# Patient Record
Sex: Male | Born: 1950 | Race: White | Hispanic: No | Marital: Married | Smoking: Never smoker
Health system: Southern US, Community
[De-identification: ages and names within clinical notes are randomized; demographics above are authoritative.]

## PROBLEM LIST (undated history)

## (undated) DIAGNOSIS — Z87442 Personal history of urinary calculi: Secondary | ICD-10-CM

## (undated) DIAGNOSIS — Z85828 Personal history of other malignant neoplasm of skin: Secondary | ICD-10-CM

## (undated) DIAGNOSIS — K5790 Diverticulosis of intestine, part unspecified, without perforation or abscess without bleeding: Secondary | ICD-10-CM

## (undated) DIAGNOSIS — L570 Actinic keratosis: Secondary | ICD-10-CM

## (undated) DIAGNOSIS — C801 Malignant (primary) neoplasm, unspecified: Secondary | ICD-10-CM

## (undated) DIAGNOSIS — M1990 Unspecified osteoarthritis, unspecified site: Secondary | ICD-10-CM

## (undated) HISTORY — DX: Personal history of other malignant neoplasm of skin: Z85.828

## (undated) HISTORY — DX: Actinic keratosis: L57.0

---

## 2005-02-07 ENCOUNTER — Ambulatory Visit: Payer: Self-pay | Admitting: Family Medicine

## 2005-02-21 ENCOUNTER — Ambulatory Visit: Payer: Self-pay | Admitting: Urology

## 2006-04-07 ENCOUNTER — Ambulatory Visit: Payer: Self-pay | Admitting: Urology

## 2007-04-12 ENCOUNTER — Ambulatory Visit: Payer: Self-pay | Admitting: Urology

## 2007-04-22 ENCOUNTER — Ambulatory Visit: Payer: Self-pay | Admitting: Urology

## 2007-07-17 ENCOUNTER — Emergency Department: Payer: Self-pay | Admitting: Emergency Medicine

## 2007-07-26 ENCOUNTER — Emergency Department: Payer: Self-pay | Admitting: Emergency Medicine

## 2009-03-10 IMAGING — CT CT ABDOMEN WO/W CM
2 of 4 series · 14 of 32 positions shown, 19 images · non-contrast
Comparison: none

REASON FOR EXAM: Left renal mass
COMMENTS:

PROCEDURE:     CT  - CT ABDOMEN STANDARD W/WO  - April 22, 2007  [DATE]
RESULT:     Triphasic CT of the abdomen is performed utilizing 100 ml of
Fsovue-YMR iodinated intravenous contrast.
Comparison is made to a previous CT dated 02/21/2005.

[Series 2: soft tissue w/o · axial · non-contrast · 0.68mm/px · z∈[-801,-606]mm · 8 of 51 slices shown, 13 images]
[im 6/51  soft-tissue]
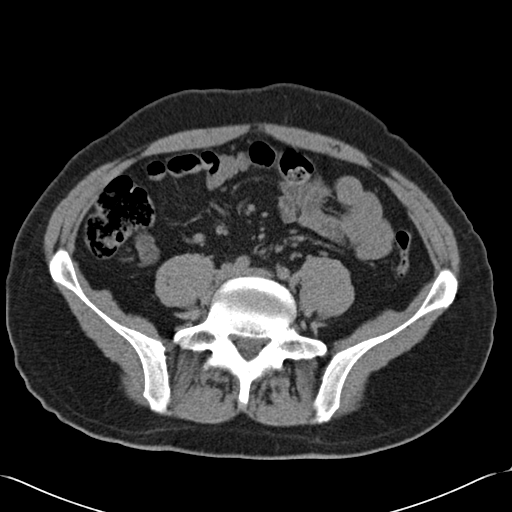
[im 6/51  bone]
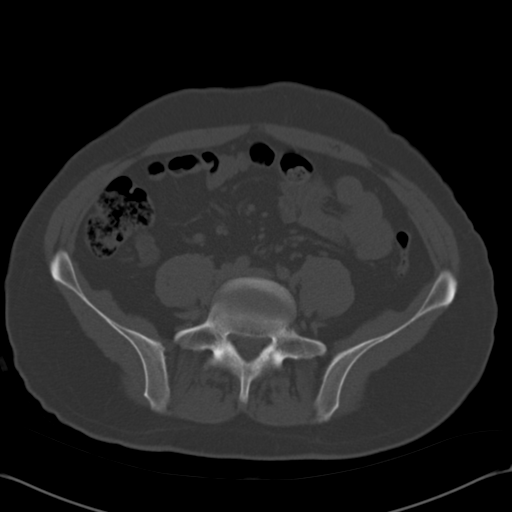
[im 12/51  soft-tissue]
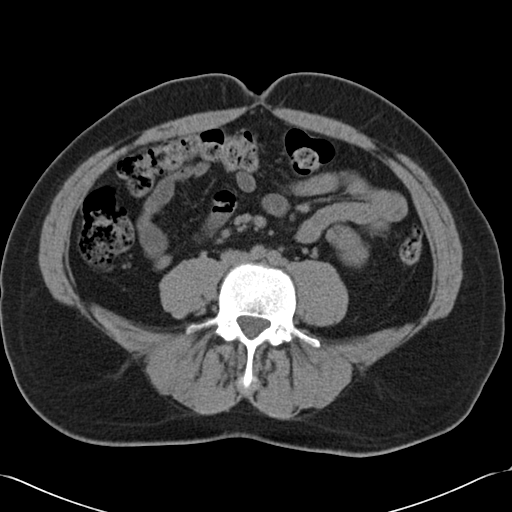
[im 17/51  soft-tissue]
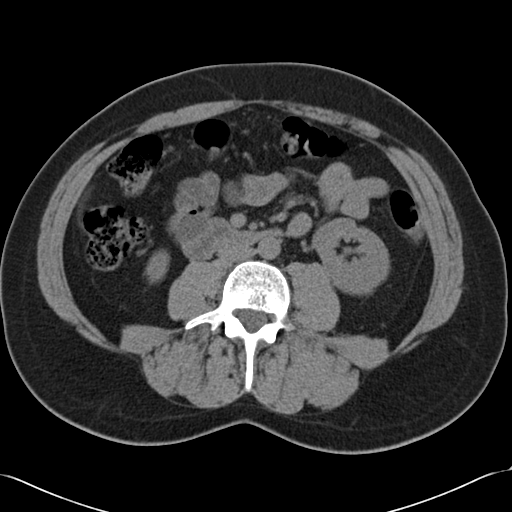
[im 23/51  soft-tissue]
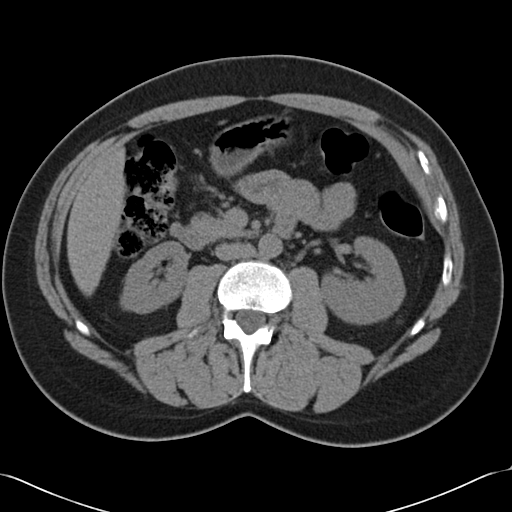
[im 28/51  soft-tissue]
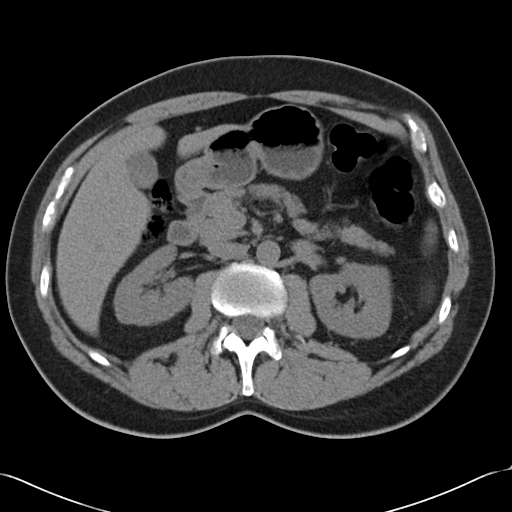
[im 28/51  lung]
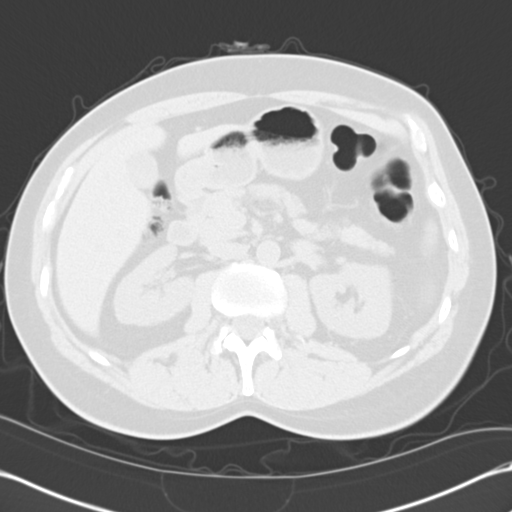
[im 34/51  soft-tissue]
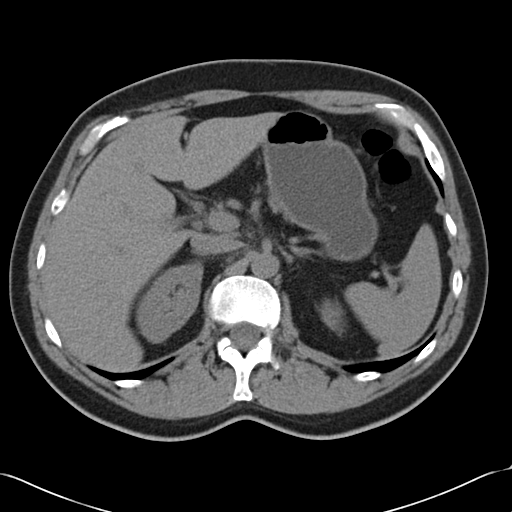
[im 34/51  lung]
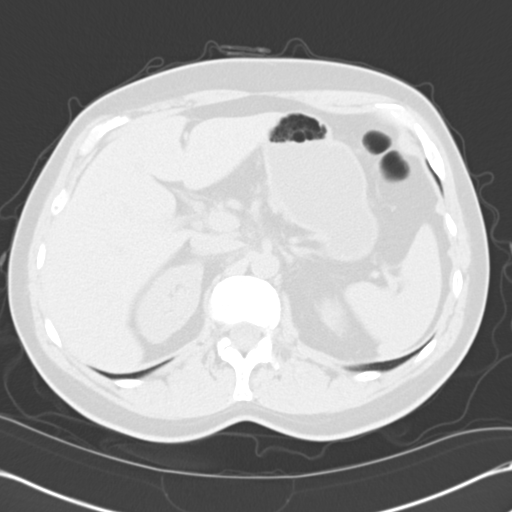
[im 39/51  soft-tissue]
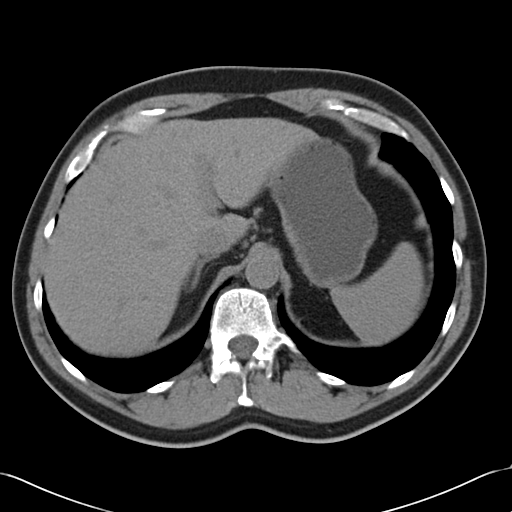
[im 39/51  lung]
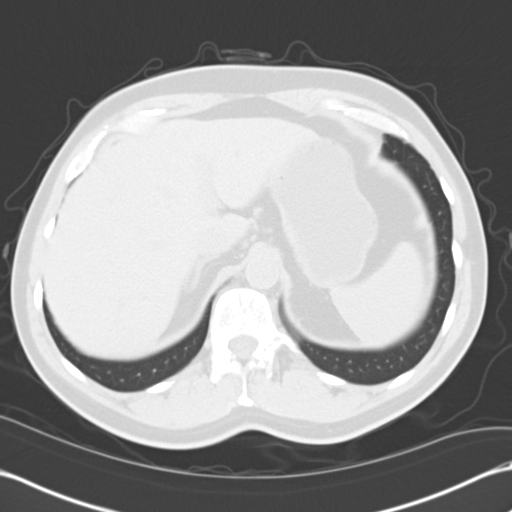
[im 45/51  soft-tissue]
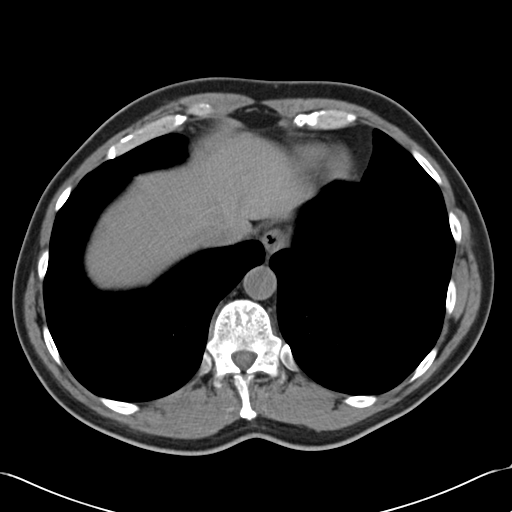
[im 45/51  lung]
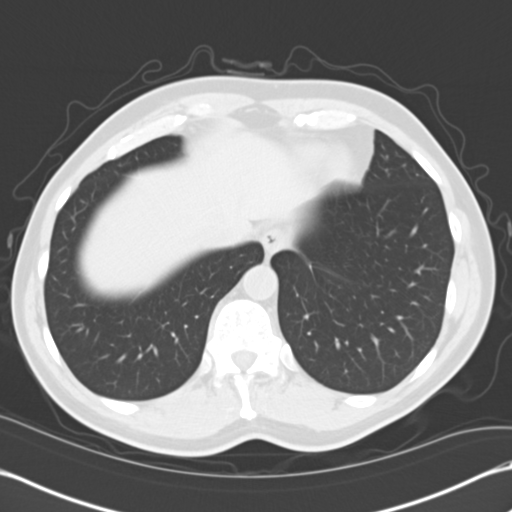

[Series 6: soft tissue delay · axial · delayed · 0.68mm/px · z∈[-797,-662]mm · 6 of 50 slices shown]
[im 6/50  soft-tissue]
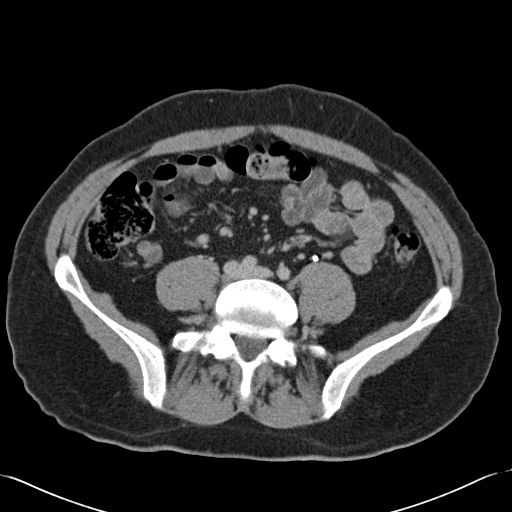
[im 11/50  soft-tissue]
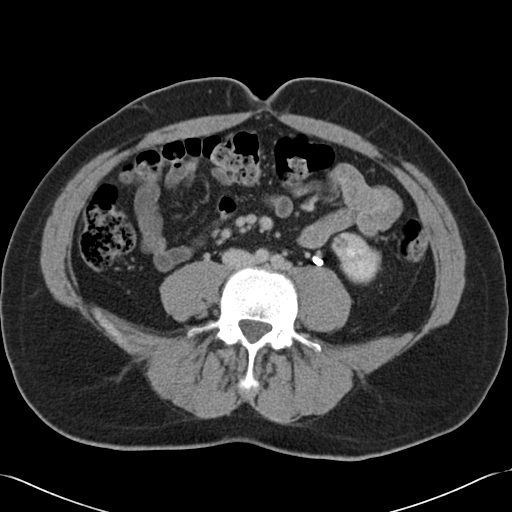
[im 17/50  soft-tissue]
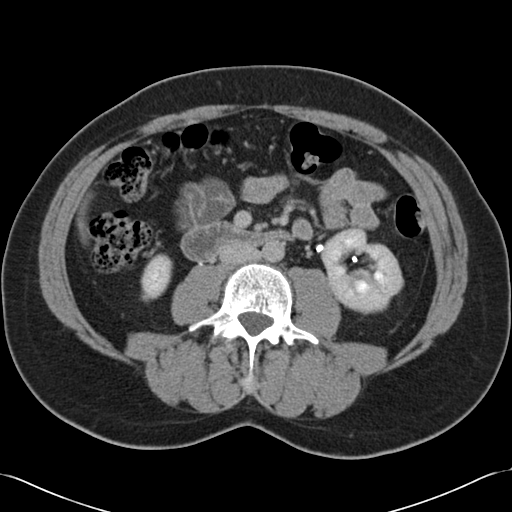
[im 22/50  soft-tissue]
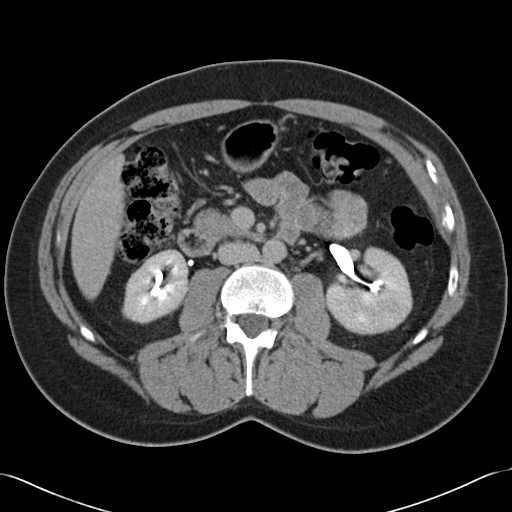
[im 28/50  soft-tissue]
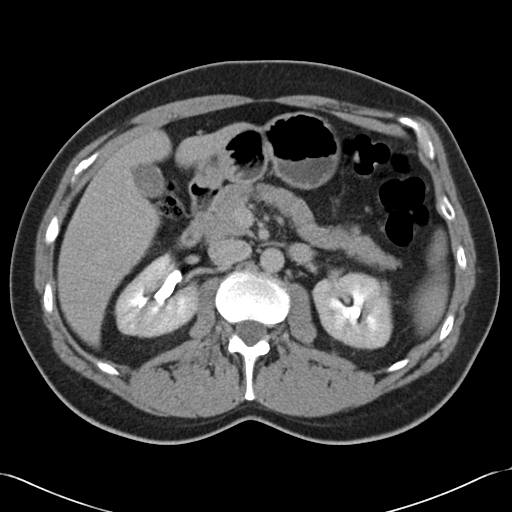
[im 33/50  soft-tissue]
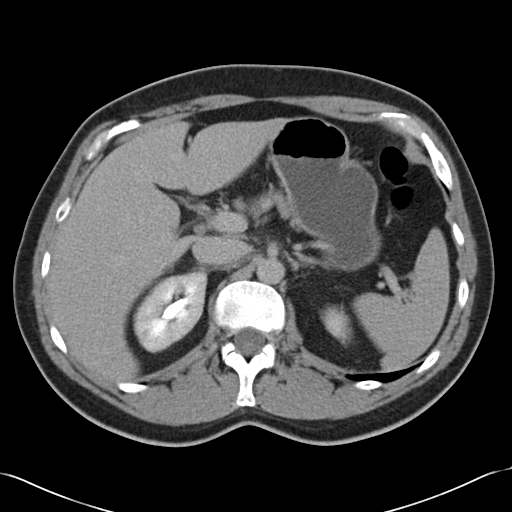

[14 of 32 positions shown; findings below may reference images not displayed]

FINDINGS: Images through the base of the lungs demonstrate normal aeration
with no mass, free air or free fluid.

The precontrast images demonstrate no evidence of hydronephrosis or
hydroureter. No definite urinary tract calculi are evident.

Following contrast administration, the kidneys appear to enhance normally.
There is some low attenuation along the anterolateral aspect of the mid to
lower pole region of the RIGHT kidney which is too small to accurately
characterize. There may be some sludge in the gallbladder. Delayed images
suggest an ill-defined area of low attenuation along the inferior pole of
the LEFT kidney anteriorly. This is unchanged when compared to the prior
study and again difficult to characterize. No definite solid masses are seen
otherwise. No findings suggestive of cysts are present otherwise.

The liver, spleen, adrenal glands, kidneys and gallbladder are otherwise
unremarkable. The abdominal aorta is normal in caliber. There is no free
fluid or free air. There is no abnormal fluid collection or abscess.
IMPRESSION: 1.  Possible gallbladder sludge.
2.  Low attenuation lesion in the RIGHT kidney which is stable and may
represent a small cyst.
3.  Less than 10.0 mm, low attenuation area in the lower pole the LEFT
kidney anteriorly which is unchanged and difficult to characterize further.

## 2010-03-17 HISTORY — PX: COLON SURGERY: SHX602

## 2010-11-21 ENCOUNTER — Ambulatory Visit: Payer: Self-pay | Admitting: Surgery

## 2010-11-28 ENCOUNTER — Ambulatory Visit: Payer: Self-pay | Admitting: Surgery

## 2011-03-18 HISTORY — PX: CHOLECYSTECTOMY: SHX55

## 2011-10-27 ENCOUNTER — Ambulatory Visit: Payer: Self-pay | Admitting: Gastroenterology

## 2011-11-10 ENCOUNTER — Observation Stay: Payer: Self-pay | Admitting: General Surgery

## 2011-11-10 ENCOUNTER — Ambulatory Visit: Payer: Self-pay | Admitting: Family Medicine

## 2011-11-10 LAB — HEPATIC FUNCTION PANEL A (ARMC)
Albumin: 3.7 g/dL (ref 3.4–5.0)
SGOT(AST): 62 U/L — ABNORMAL HIGH (ref 15–37)
SGPT (ALT): 62 U/L (ref 12–78)
Total Protein: 8 g/dL (ref 6.4–8.2)

## 2011-11-10 LAB — CBC WITH DIFFERENTIAL/PLATELET
Basophil %: 0.7 %
Eosinophil #: 0.1 10*3/uL (ref 0.0–0.7)
Eosinophil %: 0.7 %
HGB: 16.3 g/dL (ref 13.0–18.0)
Lymphocyte %: 13.6 %
MCHC: 33.7 g/dL (ref 32.0–36.0)
MCV: 91 fL (ref 80–100)
Monocyte #: 1 x10 3/mm (ref 0.2–1.0)
Neutrophil #: 7.9 10*3/uL — ABNORMAL HIGH (ref 1.4–6.5)
Neutrophil %: 75.3 %
Platelet: 224 10*3/uL (ref 150–440)
RBC: 5.31 10*6/uL (ref 4.40–5.90)

## 2011-11-12 LAB — PATHOLOGY REPORT

## 2011-11-15 LAB — WOUND CULTURE

## 2011-11-16 LAB — CULTURE, BLOOD (SINGLE)

## 2013-02-28 ENCOUNTER — Ambulatory Visit: Payer: Self-pay | Admitting: Family Medicine

## 2013-05-26 DIAGNOSIS — Z85828 Personal history of other malignant neoplasm of skin: Secondary | ICD-10-CM

## 2013-05-26 HISTORY — DX: Personal history of other malignant neoplasm of skin: Z85.828

## 2013-09-29 IMAGING — CR DG CHOLANGIOGRAM OPERATIVE
1 series · 1 of 1 positions shown · non-contrast
Comparison: none

REASON FOR EXAM: cholelithiasis
COMMENTS:

PROCEDURE:     DXR - DXR CHOLANGIOGRAM OP (INITIAL)  - November 11, 2011 [DATE]
RESULT:     Comparison: Abdominal ultrasound 11/10/2011

[[id] new series]
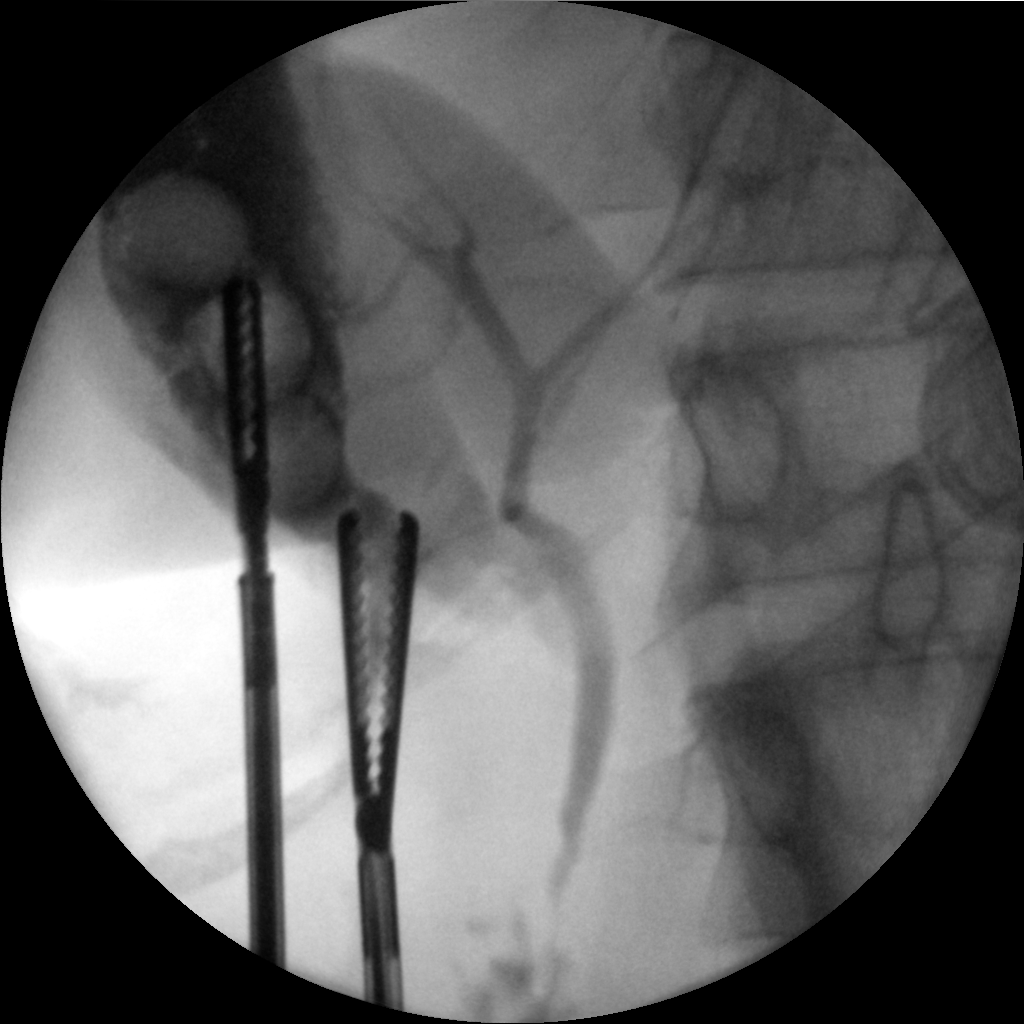

[1 of 1 positions shown; findings below may reference images not displayed]

FINDINGS: Single fluoroscopic image was submitted from intraoperative cholangiogram.
There are multiple filling defects within the gallbladder likely secondary
to gallstones. There is opacification of the common bile duct and central
intrahepatic biliary ducts, as was as the duodenum. No intraluminal filling
defect identified in the common bile duct. There is smooth tapering of the
common bile duct to the ampulla. The remainder of the interpretation is
deferred to the performing clinician.
IMPRESSION: Please see above.

[REDACTED]

## 2014-07-04 NOTE — Op Note (Signed)
PATIENT NAME:  Stephen Fry, Stephen Fry MR#:  [id number] DATE OF BIRTH:  11/29/1950  DATE OF PROCEDURE:  11/11/2011  PREOPERATIVE DIAGNOSIS: Acute cholecystitis and cholelithiasis.   POSTOPERATIVE DIAGNOSIS: Acute cholecystitis and cholelithiasis.   OPERATIVE PROCEDURE: Laparoscopic cholecystectomy with intraoperative cholangiograms.   SURGEON: Robert Bellow, MD  ANESTHESIA: General endotracheal under Dr. Kayleen Memos.   ESTIMATED BLOOD LOSS: 5 mL.   CLINICAL NOTE: This 64 year old male had been in his usual state of health until two days prior to admission when he developed vague upper abdominal discomfort. This increased over the next 24 hours. An ultrasound obtained yesterday suggested acute cholecystitis. He was admitted, placed on IV antibiotics and is brought to the Operating Room at this time for planned cholecystectomy. Pneumatic compression stockings and TED stockings were used for deep vein thrombosis prophylaxis. The patient did received Invanz the day prior to the procedure.   DESCRIPTION OF PROCEDURE: The patient underwent general endotracheal anesthesia without difficulty. Hair was removed from the abdomen with clippers. ChloraPrep was applied. A Veress needle was placed through a transumbilical incision. After assuring intra-abdominal location with the hanging drop test, the abdomen was insufflated with CO2 at 10 mmHg pressure. This was later increased to 12 mm for better visualization. A 10 mm step port was expanded and inspection showed no evidence of injury from initial port placement. The patient was placed in reverse Trendelenburg position and rolled to the left. An 11 mm Xcel port was placed in the epigastrium and two 5-mm step ports placed in the right lateral abdominal wall. There was noted to be acute inflammation of the gallbladder. Adhesions of the omentum to the gallbladder were taken down with cautery dissection as well as a Engineering geologist. The neck of the gallbladder was  cleared. The cystic duct was identified. A Kumar clamp was placed and fluoroscopic cholangiograms completed using 15 mL of one-half strength Conray 60. This showed prompt filling of the right and left hepatic ducts, normal caliber of the common bile duct, and free flow into the duodenum. No evidence of retained stones. The cystic duct and multiple branches of the cystic artery were doubly clipped and divided. The gallbladder was then removed from the liver bed making use of hook cautery dissection. Of note, the gallbladder was decompressed to allow grasping forceps to attached to it at the beginning of the procedure. The gallbladder was placed in an Endo Catch bag and then delivered to the umbilical port site. It was now necessary to expand the incision to allow extraction of the thick walled gallbladder and large stones. After the gallbladder had been retrieved pneumoperitoneum was re-established. Examination of the epigastric site showed no evidence of injury from initial port placement. The right upper quadrant was irrigated with lactated Ringer's solution. Good hemostasis was noted. Clip placement was felt to be appropriate. The abdomen was then desufflated and ports removed under direct vision. The fascia at the umbilicus was closed with interrupted 0 PDS suture in figure-of-eight fashion. Skin incisions were closed with 4-0 Vicryl subcuticular sutures. Benzoin, Steri-Strips, Telfa, and Tegaderm dressings were the applied.   Patient tolerated the procedure well and was taken to the recovery room in stable condition.  ____________________________ Robert Bellow, MD jwb:cms D: 11/11/2011 19:34:26 ET T: 11/12/2011 10:24:25 ET JOB#: [id number]  cc: Robert Bellow, MD, <Dictator> Jobe Igo. Gilford Raid, MD JEFFREY Amedeo Kinsman MD ELECTRONICALLY SIGNED 11/13/2011 20:39

## 2015-01-12 ENCOUNTER — Encounter: Payer: Self-pay | Admitting: *Deleted

## 2015-01-15 ENCOUNTER — Encounter: Payer: Self-pay | Admitting: *Deleted

## 2015-01-15 ENCOUNTER — Encounter
Admission: RE | Disposition: A | Discharge status: Home Health | Payer: Self-pay | Source: Ambulatory Visit | Attending: Gastroenterology

## 2015-01-15 ENCOUNTER — Ambulatory Visit
Admission: RE | Admit: 2015-01-15 | Discharge: 2015-01-15 | Disposition: A | Discharge status: Home Health | Payer: Managed Care, Other (non HMO) | Source: Ambulatory Visit | Attending: Gastroenterology | Admitting: Gastroenterology

## 2015-01-15 ENCOUNTER — Ambulatory Visit: Payer: Managed Care, Other (non HMO) | Admitting: *Deleted

## 2015-01-15 DIAGNOSIS — Z86010 Personal history of colonic polyps: Secondary | ICD-10-CM | POA: Insufficient documentation

## 2015-01-15 DIAGNOSIS — D123 Benign neoplasm of transverse colon: Secondary | ICD-10-CM | POA: Insufficient documentation

## 2015-01-15 DIAGNOSIS — Z79899 Other long term (current) drug therapy: Secondary | ICD-10-CM | POA: Insufficient documentation

## 2015-01-15 DIAGNOSIS — D124 Benign neoplasm of descending colon: Secondary | ICD-10-CM | POA: Diagnosis not present

## 2015-01-15 DIAGNOSIS — K5730 Diverticulosis of large intestine without perforation or abscess without bleeding: Secondary | ICD-10-CM | POA: Diagnosis not present

## 2015-01-15 HISTORY — PX: COLONOSCOPY WITH PROPOFOL: SHX5780

## 2015-01-15 HISTORY — DX: Diverticulosis of intestine, part unspecified, without perforation or abscess without bleeding: K57.90

## 2015-01-15 SURGERY — COLONOSCOPY WITH PROPOFOL
Anesthesia: General

## 2015-01-15 MED ORDER — SODIUM CHLORIDE 0.9 % IV SOLN: INTRAVENOUS | Status: DC

## 2015-01-15 MED ORDER — SODIUM CHLORIDE 0.9 % IV SOLN
INTRAVENOUS | Status: DC
  Administered 2015-01-15: 1000 mL via INTRAVENOUS

## 2015-01-15 MED ORDER — PROPOFOL 500 MG/50ML IV EMUL
INTRAVENOUS | Status: DC | PRN
  Administered 2015-01-15: 120 ug/kg/min via INTRAVENOUS

## 2015-01-15 MED ORDER — FENTANYL CITRATE (PF) 100 MCG/2ML IJ SOLN
INTRAMUSCULAR | Status: DC | PRN
  Administered 2015-01-15: 50 ug via INTRAVENOUS

## 2015-01-15 MED ORDER — MIDAZOLAM HCL 2 MG/2ML IJ SOLN
INTRAMUSCULAR | Status: DC | PRN
  Administered 2015-01-15: 1 mg via INTRAVENOUS

## 2015-01-15 NOTE — Anesthesia Postprocedure Evaluation (Signed)
  Anesthesia Post-op Note  Patient: Stephen Fry  Procedure(s) Performed: Procedure(s): COLONOSCOPY WITH PROPOFOL (N/A)  Anesthesia type:General  Patient location: PACU  Post pain: Pain level controlled  Post assessment: Post-op Vital signs reviewed, Patient's Cardiovascular Status Stable, Respiratory Function Stable, Patent Airway and No signs of Nausea or vomiting  Post vital signs: Reviewed and stable  Last Vitals:  Filed Vitals:   01/15/15 1331  BP: 127/63  Pulse: 72  Temp: 37 C  Resp: 20    Level of consciousness: awake, alert  and patient cooperative  Complications: No apparent anesthesia complications

## 2015-01-15 NOTE — H&P (Signed)
Outpatient short stay form Pre-procedure 01/15/2015 2:16 PM [name] MD  Primary Physician: Dr. Ramonita Lab  Reason for visit:  Colonoscopy  History of present illness:  Patient is a 64 year old male presenting today for a colonoscopy. His last colonoscopy was in 2013 and he has a history of large adenoma. He tolerated his prep well. He denies use of any over-the-counter aspirin products or any blood thinners.    Current facility-administered medications:  .  0.9 %  sodium chloride infusion, , Intravenous, Continuous, [name], MD, Last Rate: 20 mL/hr at 01/15/15 1340, 1,000 mL at 01/15/15 1340 .  0.9 %  sodium chloride infusion, , Intravenous, Continuous, [name], MD  Prescriptions prior to admission  Medication Sig Dispense Refill Last Dose  . meloxicam (MOBIC) 15 MG tablet Take 15 mg by mouth daily.   Not Taking at Unknown time     No Known Allergies   Past Medical History  Diagnosis Date  . Diverticulosis     Review of systems:      Physical Exam    Heart and lungs: Regular rate and rhythm without rub or gallop, lungs are bilaterally clear    HEENT: Normocephalic atraumatic eyes are anicteric    Other:     Pertinant exam for procedure: soft nontender nondistended, bowel sounds normal    Planned proceedures: colonosocpy and indicated proceedures. I have discussed the risks benefits and complications of procedures to include not limited to bleeding, infection, perforation and the risk of sedation and the patient wishes to proceed.    [name], MD Gastroenterology 01/15/2015  2:16 PM

## 2015-01-15 NOTE — Op Note (Signed)
[facility] Gastroenterology Patient Name: Oluwadarasimi Redmon Procedure Date: 01/15/2015 2:22 PM MRN: [id number] Account #: [id number] Date of Birth: February 25, 1951 Admit Type: Outpatient Age: 64 Room: [facility] ENDO ROOM 3 Gender: Male Note Status: Finalized Procedure:         Colonoscopy Indications:       Personal history of colonic polyps Providers:         [name], MD Referring MD:      [name], MD (Referring MD) Medicines:         Monitored Anesthesia Care Complications:     No immediate complications. Procedure:         Pre-Anesthesia Assessment:                    - ASA Grade Assessment: II - A patient with mild systemic                     disease.                    - ASA Grade Assessment: II - A patient with mild systemic                     disease.                    After obtaining informed consent, the colonoscope was                     passed under direct vision. Throughout the procedure, the                     patient's blood pressure, pulse, and oxygen saturations                     were monitored continuously. The Colonoscope was                     introduced through the anus and advanced to the the cecum,                     identified by appendiceal orifice and ileocecal valve. The                     colonoscopy was performed without difficulty. The patient                     tolerated the procedure well. The quality of the bowel                     preparation was good. Findings:      Two sessile polyps were found at the hepatic flexure. The polyps were 2       to 4 mm in size. These polyps were removed with a cold snare. Resection       and retrieval were complete.      A 2 mm polyp was found in the descending colon. The polyp was sessile.       The polyp was removed with a cold biopsy forceps. Resection and       retrieval were complete.      A few small-mouthed diverticula were found in the sigmoid colon and in   the descending colon.      The digital rectal exam was normal. Impression:        - Two  2 to 4 mm polyps at the hepatic flexure. Resected                     and retrieved.                    - One 2 mm polyp in the descending colon. Resected and                     retrieved.                    - Diverticulosis in the sigmoid colon and in the                     descending colon. Recommendation:    - Await pathology results.                    - Telephone GI clinic for pathology results in 1 week. Procedure Code(s): --- Professional ---                    [phone], Colonoscopy, flexible; with removal of tumor(s),                     polyp(s), or other lesion(s) by snare technique                    45380, 93, Colonoscopy, flexible; with biopsy, single or                     multiple Diagnosis Code(s): --- Professional ---                    211.3, Benign neoplasm of colon                    V12.72, Personal history of colonic polyps                    562.10, Diverticulosis of colon (without mention of                     hemorrhage) CPT copyright 2014 American Medical Association. All rights reserved. The codes documented in this report are preliminary and upon coder review may  be revised to meet current compliance requirements. [name], MD 01/15/2015 2:56:06 PM This report has been signed electronically. Number of Addenda: 0 Note Initiated On: 01/15/2015 2:22 PM Scope Withdrawal Time: 0 hours 13 minutes 0 seconds  Total Procedure Duration: 0 hours 24 minutes 38 seconds       [facility]

## 2015-01-15 NOTE — Transfer of Care (Signed)
Immediate Anesthesia Transfer of Care Note  Patient: Stephen Fry  Procedure(s) Performed: Procedure(s): COLONOSCOPY WITH PROPOFOL (N/A)  Patient Location: PACU and Endoscopy Unit  Anesthesia Type:General  Level of Consciousness: sedated and responds to stimulation  Airway & Oxygen Therapy: Patient Spontanous Breathing and Patient connected to nasal cannula oxygen  Post-op Assessment: Report given to RN and Post -op Vital signs reviewed and stable  Post vital signs: Reviewed and stable  Last Vitals:  Filed Vitals:   01/15/15 1458  BP: 87/57  Pulse: 81  Temp: 35.6 C  Resp: 14    Complications: No apparent anesthesia complications

## 2015-01-15 NOTE — Anesthesia Preprocedure Evaluation (Signed)
Anesthesia Evaluation  Patient identified by MRN, date of birth, ID band Patient awake    Reviewed: Allergy & Precautions, NPO status , Patient's Chart, lab work & pertinent test results  Airway Mallampati: II  TM Distance: >3 FB Neck ROM: Full    Dental  (+) Teeth Intact   Pulmonary           Cardiovascular Exercise Tolerance: Good negative cardio ROS Normal cardiovascular exam     Neuro/Psych    GI/Hepatic He had a large polyp in 2012, and it was resected. He is under surveillance--has had multiple colonoscopies.   Endo/Other    Renal/GU      Musculoskeletal   Abdominal Normal abdominal exam  (+)   Peds  Hematology   Anesthesia Other Findings   Reproductive/Obstetrics                             Anesthesia Physical Anesthesia Plan  ASA: II  Anesthesia Plan: General   Post-op Pain Management:    Induction: Intravenous  Airway Management Planned: Nasal Cannula  Additional Equipment:   Intra-op Plan:   Post-operative Plan:   Informed Consent: I have reviewed the patients History and Physical, chart, labs and discussed the procedure including the risks, benefits and alternatives for the proposed anesthesia with the patient or authorized representative who has indicated his/her understanding and acceptance.     Plan Discussed with: CRNA  Anesthesia Plan Comments:         Anesthesia Quick Evaluation

## 2015-01-16 ENCOUNTER — Encounter: Payer: Self-pay | Admitting: Gastroenterology

## 2015-01-17 LAB — SURGICAL PATHOLOGY

## 2015-12-31 DIAGNOSIS — C4491 Basal cell carcinoma of skin, unspecified: Secondary | ICD-10-CM

## 2015-12-31 HISTORY — DX: Basal cell carcinoma of skin, unspecified: C44.91

## 2016-03-17 DIAGNOSIS — Z87442 Personal history of urinary calculi: Secondary | ICD-10-CM

## 2016-03-17 HISTORY — DX: Personal history of urinary calculi: Z87.442

## 2016-06-09 ENCOUNTER — Other Ambulatory Visit: Payer: Self-pay | Admitting: Orthopedic Surgery

## 2016-06-17 ENCOUNTER — Encounter
Admission: RE | Admit: 2016-06-17 | Discharge: 2016-06-17 | Disposition: A | Discharge status: Home / Self Care | Payer: Worker's Compensation | Source: Ambulatory Visit | Attending: Orthopedic Surgery | Admitting: Orthopedic Surgery

## 2016-06-17 DIAGNOSIS — Z01812 Encounter for preprocedural laboratory examination: Secondary | ICD-10-CM | POA: Insufficient documentation

## 2016-06-17 HISTORY — DX: Malignant (primary) neoplasm, unspecified: C80.1

## 2016-06-17 HISTORY — DX: Unspecified osteoarthritis, unspecified site: M19.90

## 2016-06-17 HISTORY — DX: Personal history of urinary calculi: Z87.442

## 2016-06-17 NOTE — Patient Instructions (Signed)
  Your procedure is scheduled on: 06-24-16 (Tuesday) Report to Same Day Surgery 2nd floor medical mall [facility] Entrance-take elevator on left to 2nd floor.  Check in with surgery information desk.) To find out your arrival time please call [phone] between 1PM - 3PM on 06-23-16 (Monday)  Remember: Instructions that are not followed completely may result in serious medical risk, up to and including death, or upon the discretion of your surgeon and anesthesiologist your surgery may need to be rescheduled.    _x___ 1. Do not eat food or drink liquids after midnight. No gum chewing or hard candies.     __x__ 2. No Alcohol for 24 hours before or after surgery.   __x__3. No Smoking for 24 prior to surgery.   ____  4. Bring all medications with you on the day of surgery if instructed.    __x__ 5. Notify your doctor if there is any change in your medical condition     (cold, fever, infections).     Do not wear jewelry, make-up, hairpins, clips or nail polish.  Do not wear lotions, powders, or perfumes. You may wear deodorant.  Do not shave 48 hours prior to surgery. Men may shave face and neck.  Do not bring valuables to the hospital.    [facility] is not responsible for any belongings or valuables.               Contacts, dentures or bridgework may not be worn into surgery.  Leave your suitcase in the car. After surgery it may be brought to your room.  For patients admitted to the hospital, discharge time is determined by your treatment team.   Patients discharged the day of surgery will not be allowed to drive home.  You will need someone to drive you home and stay with you the night of your procedure.    Please read over the following fact sheets that you were given:     ____ Take anti-hypertensive (unless it includes a diuretic), cardiac, seizure, asthma,  anti-reflux and psychiatric medicines. These include:  1. NONE  2.  3.  4.  5.  6.  ____Fleets enema or Magnesium  Citrate as directed.   _x___ Use CHG Soap or sage wipes as directed on instruction sheet   ____ Use inhalers on the day of surgery and bring to hospital day of surgery  ____ Stop Metformin and Janumet 2 days prior to surgery.    ____ Take 1/2 of usual insulin dose the night before surgery and none on the morning     surgery.   ____ Follow recommendations from Cardiologist, Pulmonologist or PCP regarding stopping Aspirin, Coumadin, Pllavix ,Eliquis, Effient, or Pradaxa, and Pletal.  X____Stop Anti-inflammatories such as Advil, Aleve, IBUPROFEN, Motrin, Naproxen, Naprosyn, Goodies powders or aspirin products NOW-OK to take Tylenol   _x___ Stop supplements until after surgery-STOP MELATONIN NOW   ____ Bring C-Pap to the hospital.

## 2016-06-18 ENCOUNTER — Encounter
Admission: RE | Admit: 2016-06-18 | Discharge: 2016-06-18 | Disposition: A | Discharge status: Home / Self Care | Payer: Worker's Compensation | Source: Ambulatory Visit | Attending: Orthopedic Surgery | Admitting: Orthopedic Surgery

## 2016-06-18 DIAGNOSIS — Z01812 Encounter for preprocedural laboratory examination: Secondary | ICD-10-CM | POA: Diagnosis present

## 2016-06-18 LAB — BASIC METABOLIC PANEL
Anion gap: 5 (ref 5–15)
BUN: 12 mg/dL (ref 6–20)
CALCIUM: 9.5 mg/dL (ref 8.9–10.3)
CO2: 30 mmol/L (ref 22–32)
CREATININE: 0.96 mg/dL (ref 0.61–1.24)
Chloride: 104 mmol/L (ref 101–111)
GFR calc Af Amer: >60 mL/min (ref >60)
GLUCOSE: 90 mg/dL (ref 65–99)
Potassium: 4.1 mmol/L (ref 3.5–5.1)
Sodium: 139 mmol/L (ref 135–145)

## 2016-06-18 LAB — CBC WITH DIFFERENTIAL/PLATELET
BASOS ABS: 0.1 10*3/uL (ref 0–0.1)
BASOS PCT: 1 %
EOS ABS: 0.4 10*3/uL (ref 0–0.7)
Eosinophils Relative: 6 %
HCT: 45.5 % (ref 40.0–52.0)
Hemoglobin: 15.4 g/dL (ref 13.0–18.0)
Lymphocytes Relative: 29 %
Lymphs Abs: 1.7 10*3/uL (ref 1.0–3.6)
MCH: 30.2 pg (ref 26.0–34.0)
MCHC: 33.9 g/dL (ref 32.0–36.0)
MCV: 89.3 fL (ref 80.0–100.0)
MONO ABS: 0.6 10*3/uL (ref 0.2–1.0)
MONOS PCT: 11 %
Neutro Abs: 3.1 10*3/uL (ref 1.4–6.5)
Neutrophils Relative %: 53 %
PLATELETS: 226 10*3/uL (ref 150–440)
RBC: 5.09 MIL/uL (ref 4.40–5.90)
RDW: 13.5 % (ref 11.5–14.5)
WBC: 5.9 10*3/uL (ref 3.8–10.6)

## 2016-06-18 LAB — PROTIME-INR
INR: 0.92
PROTHROMBIN TIME: 12.4 s (ref 11.4–15.2)

## 2016-06-18 LAB — APTT: APTT: 30 s (ref 24–36)

## 2016-06-23 MED ORDER — FAMOTIDINE 20 MG PO TABS
20.0000 mg | ORAL_TABLET | Freq: Once | ORAL | Status: AC
  Administered 2016-06-24: 20 mg via ORAL

## 2016-06-23 MED ORDER — CEFAZOLIN SODIUM-DEXTROSE 2-4 GM/100ML-% IV SOLN: 2.0000 g | INTRAVENOUS | Status: AC

## 2016-06-23 NOTE — Pre-Procedure Instructions (Signed)
MEDICAL CLEARANCE ON CHART FROM PCP-LOW RISK 

## 2016-06-24 ENCOUNTER — Encounter: Payer: Self-pay | Admitting: *Deleted

## 2016-06-24 ENCOUNTER — Ambulatory Visit: Payer: Worker's Compensation | Admitting: Anesthesiology

## 2016-06-24 ENCOUNTER — Ambulatory Visit
Admission: RE | Admit: 2016-06-24 | Discharge: 2016-06-24 | Disposition: A | Discharge status: Home / Self Care | Payer: Worker's Compensation | Source: Ambulatory Visit | Attending: Orthopedic Surgery | Admitting: Orthopedic Surgery

## 2016-06-24 ENCOUNTER — Encounter
Admission: RE | Disposition: A | Discharge status: Home / Self Care | Payer: Self-pay | Source: Ambulatory Visit | Attending: Orthopedic Surgery

## 2016-06-24 DIAGNOSIS — S43432A Superior glenoid labrum lesion of left shoulder, initial encounter: Secondary | ICD-10-CM | POA: Diagnosis not present

## 2016-06-24 DIAGNOSIS — M7552 Bursitis of left shoulder: Secondary | ICD-10-CM | POA: Insufficient documentation

## 2016-06-24 DIAGNOSIS — M7542 Impingement syndrome of left shoulder: Secondary | ICD-10-CM | POA: Insufficient documentation

## 2016-06-24 DIAGNOSIS — Z79899 Other long term (current) drug therapy: Secondary | ICD-10-CM | POA: Diagnosis not present

## 2016-06-24 DIAGNOSIS — E559 Vitamin D deficiency, unspecified: Secondary | ICD-10-CM | POA: Diagnosis not present

## 2016-06-24 DIAGNOSIS — X58XXXA Exposure to other specified factors, initial encounter: Secondary | ICD-10-CM | POA: Diagnosis not present

## 2016-06-24 DIAGNOSIS — G479 Sleep disorder, unspecified: Secondary | ICD-10-CM | POA: Insufficient documentation

## 2016-06-24 HISTORY — PX: SHOULDER ARTHROSCOPY WITH BANKART REPAIR: SHX5673

## 2016-06-24 SURGERY — SHOULDER ARTHROSCOPY WITH BANKART REPAIR
Anesthesia: Regional | Laterality: Left | Wound class: Clean

## 2016-06-24 MED ORDER — ONDANSETRON HCL 4 MG/2ML IJ SOLN
INTRAMUSCULAR | Status: DC | PRN
  Administered 2016-06-24: 4 mg via INTRAVENOUS

## 2016-06-24 MED ORDER — ACETAMINOPHEN 10 MG/ML IV SOLN
INTRAVENOUS | Status: DC | PRN
  Administered 2016-06-24: 1000 mg via INTRAVENOUS

## 2016-06-24 MED ORDER — ACETAMINOPHEN 10 MG/ML IV SOLN
INTRAVENOUS | Status: AC
  Filled 2016-06-24: qty 100

## 2016-06-24 MED ORDER — ROPIVACAINE HCL 5 MG/ML IJ SOLN
INTRAMUSCULAR | Status: DC | PRN
  Administered 2016-06-24: 20 mL via PERINEURAL
  Administered 2016-06-24: 10 mL via PERINEURAL

## 2016-06-24 MED ORDER — BUPIVACAINE HCL (PF) 0.25 % IJ SOLN
INTRAMUSCULAR | Status: DC | PRN
  Administered 2016-06-24: 30 mL

## 2016-06-24 MED ORDER — DEXAMETHASONE SODIUM PHOSPHATE 10 MG/ML IJ SOLN
INTRAMUSCULAR | Status: AC
  Filled 2016-06-24: qty 1

## 2016-06-24 MED ORDER — FENTANYL CITRATE (PF) 100 MCG/2ML IJ SOLN
INTRAMUSCULAR | Status: AC
  Administered 2016-06-24: 50 ug via INTRAVENOUS
  Filled 2016-06-24: qty 2

## 2016-06-24 MED ORDER — LIDOCAINE HCL (PF) 2 % IJ SOLN
INTRAMUSCULAR | Status: AC
  Filled 2016-06-24: qty 2

## 2016-06-24 MED ORDER — FENTANYL CITRATE (PF) 100 MCG/2ML IJ SOLN: 25.0000 ug | INTRAMUSCULAR | Status: DC | PRN

## 2016-06-24 MED ORDER — ROPIVACAINE HCL 5 MG/ML IJ SOLN
INTRAMUSCULAR | Status: AC
  Filled 2016-06-24: qty 40

## 2016-06-24 MED ORDER — NEOMYCIN-POLYMYXIN B GU 40-200000 IR SOLN
Status: AC
  Filled 2016-06-24: qty 2

## 2016-06-24 MED ORDER — SUGAMMADEX SODIUM 500 MG/5ML IV SOLN
INTRAVENOUS | Status: DC | PRN
  Administered 2016-06-24: 160 mg via INTRAVENOUS

## 2016-06-24 MED ORDER — ROCURONIUM BROMIDE 50 MG/5ML IV SOLN
INTRAVENOUS | Status: AC
  Filled 2016-06-24: qty 1

## 2016-06-24 MED ORDER — LACTATED RINGERS IV SOLN
INTRAVENOUS | Status: DC
  Administered 2016-06-24: 07:00:00 via INTRAVENOUS

## 2016-06-24 MED ORDER — MIDAZOLAM HCL 2 MG/2ML IJ SOLN
1.0000 mg | Freq: Once | INTRAMUSCULAR | Status: AC
  Administered 2016-06-24: 1 mg via INTRAVENOUS

## 2016-06-24 MED ORDER — OXYCODONE HCL 5 MG/5ML PO SOLN: 5.0000 mg | Freq: Once | ORAL | Status: DC | PRN

## 2016-06-24 MED ORDER — PHENYLEPHRINE HCL 10 MG/ML IJ SOLN
INTRAMUSCULAR | Status: DC | PRN
  Administered 2016-06-24: 200 ug via INTRAVENOUS
  Administered 2016-06-24 (×2): 100 ug via INTRAVENOUS
  Administered 2016-06-24: 200 ug via INTRAVENOUS

## 2016-06-24 MED ORDER — LIDOCAINE HCL (PF) 1 % IJ SOLN
INTRAMUSCULAR | Status: AC
  Filled 2016-06-24: qty 30

## 2016-06-24 MED ORDER — EPHEDRINE SULFATE 50 MG/ML IJ SOLN
INTRAMUSCULAR | Status: AC
  Filled 2016-06-24: qty 1

## 2016-06-24 MED ORDER — ONDANSETRON HCL 4 MG PO TABS: 4.0000 mg | ORAL_TABLET | Freq: Three times a day (TID) | ORAL | 0 refills | Status: AC | PRN

## 2016-06-24 MED ORDER — DEXAMETHASONE SODIUM PHOSPHATE 10 MG/ML IJ SOLN
INTRAMUSCULAR | Status: DC | PRN
  Administered 2016-06-24: 10 mg via INTRAVENOUS

## 2016-06-24 MED ORDER — LIDOCAINE HCL (PF) 1 % IJ SOLN
INTRAMUSCULAR | Status: DC | PRN
  Administered 2016-06-24: 1 mL via INTRADERMAL

## 2016-06-24 MED ORDER — LIDOCAINE HCL (PF) 1 % IJ SOLN
INTRAMUSCULAR | Status: DC | PRN
  Administered 2016-06-24: 10 mL
  Administered 2016-06-24: 20 mL

## 2016-06-24 MED ORDER — PHENYLEPHRINE HCL 10 MG/ML IJ SOLN
INTRAMUSCULAR | Status: AC
  Filled 2016-06-24: qty 1

## 2016-06-24 MED ORDER — SUCCINYLCHOLINE CHLORIDE 20 MG/ML IJ SOLN
INTRAMUSCULAR | Status: AC
  Filled 2016-06-24: qty 1

## 2016-06-24 MED ORDER — SODIUM CHLORIDE 0.9 % IJ SOLN
INTRAMUSCULAR | Status: AC
  Filled 2016-06-24: qty 10

## 2016-06-24 MED ORDER — GLYCOPYRROLATE 0.2 MG/ML IJ SOLN
INTRAMUSCULAR | Status: DC | PRN
  Administered 2016-06-24: 0.2 mg via INTRAVENOUS

## 2016-06-24 MED ORDER — CEFAZOLIN SODIUM-DEXTROSE 2-4 GM/100ML-% IV SOLN
INTRAVENOUS | Status: AC
  Filled 2016-06-24: qty 100

## 2016-06-24 MED ORDER — PROPOFOL 10 MG/ML IV BOLUS
INTRAVENOUS | Status: DC | PRN
  Administered 2016-06-24: 200 mg via INTRAVENOUS

## 2016-06-24 MED ORDER — ONDANSETRON HCL 4 MG/2ML IJ SOLN
INTRAMUSCULAR | Status: AC
  Filled 2016-06-24: qty 2

## 2016-06-24 MED ORDER — BUPIVACAINE HCL (PF) 0.25 % IJ SOLN
INTRAMUSCULAR | Status: AC
  Filled 2016-06-24: qty 30

## 2016-06-24 MED ORDER — LIDOCAINE HCL (CARDIAC) 20 MG/ML IV SOLN
INTRAVENOUS | Status: DC | PRN
  Administered 2016-06-24: 100 mg via INTRAVENOUS

## 2016-06-24 MED ORDER — OXYCODONE HCL 5 MG PO TABS: 5.0000 mg | ORAL_TABLET | ORAL | 0 refills | Status: AC | PRN

## 2016-06-24 MED ORDER — MIDAZOLAM HCL 2 MG/2ML IJ SOLN
INTRAMUSCULAR | Status: AC
  Administered 2016-06-24: 1 mg via INTRAVENOUS
  Filled 2016-06-24: qty 2

## 2016-06-24 MED ORDER — CHLORHEXIDINE GLUCONATE CLOTH 2 % EX PADS: 6.0000 | MEDICATED_PAD | Freq: Once | CUTANEOUS | Status: DC

## 2016-06-24 MED ORDER — OXYCODONE HCL 5 MG PO TABS: 5.0000 mg | ORAL_TABLET | Freq: Once | ORAL | Status: DC | PRN

## 2016-06-24 MED ORDER — PHENYLEPHRINE HCL 10 MG/ML IJ SOLN
INTRAVENOUS | Status: DC | PRN
  Administered 2016-06-24: 25 ug/min via INTRAVENOUS

## 2016-06-24 MED ORDER — FAMOTIDINE 20 MG PO TABS
ORAL_TABLET | ORAL | Status: AC
  Administered 2016-06-24: 20 mg via ORAL
  Filled 2016-06-24: qty 1

## 2016-06-24 MED ORDER — EPINEPHRINE 30 MG/30ML IJ SOLN
INTRAMUSCULAR | Status: AC
  Filled 2016-06-24: qty 1

## 2016-06-24 MED ORDER — SUCCINYLCHOLINE CHLORIDE 20 MG/ML IJ SOLN
INTRAMUSCULAR | Status: DC | PRN
  Administered 2016-06-24: 120 mg via INTRAVENOUS

## 2016-06-24 MED ORDER — FENTANYL CITRATE (PF) 100 MCG/2ML IJ SOLN
50.0000 ug | Freq: Once | INTRAMUSCULAR | Status: AC
  Administered 2016-06-24: 50 ug via INTRAVENOUS

## 2016-06-24 MED ORDER — CEFAZOLIN SODIUM-DEXTROSE 2-3 GM-% IV SOLR
INTRAVENOUS | Status: DC | PRN
  Administered 2016-06-24: 2 g via INTRAVENOUS

## 2016-06-24 MED ORDER — FENTANYL CITRATE (PF) 100 MCG/2ML IJ SOLN
INTRAMUSCULAR | Status: AC
  Filled 2016-06-24: qty 2

## 2016-06-24 MED ORDER — SODIUM CHLORIDE 0.9 % IR SOLN
Status: DC | PRN
  Administered 2016-06-24: 500 mL

## 2016-06-24 MED ORDER — FENTANYL CITRATE (PF) 100 MCG/2ML IJ SOLN
INTRAMUSCULAR | Status: DC | PRN
  Administered 2016-06-24 (×2): 50 ug via INTRAVENOUS

## 2016-06-24 MED ORDER — ROCURONIUM BROMIDE 100 MG/10ML IV SOLN
INTRAVENOUS | Status: DC | PRN
  Administered 2016-06-24 (×2): 20 mg via INTRAVENOUS

## 2016-06-24 MED ORDER — GLYCOPYRROLATE 0.2 MG/ML IJ SOLN
INTRAMUSCULAR | Status: AC
  Filled 2016-06-24: qty 1

## 2016-06-24 MED ORDER — LIDOCAINE HCL (PF) 1 % IJ SOLN
INTRAMUSCULAR | Status: AC
  Filled 2016-06-24: qty 5

## 2016-06-24 MED ORDER — PROPOFOL 10 MG/ML IV BOLUS
INTRAVENOUS | Status: AC
  Filled 2016-06-24: qty 20

## 2016-06-24 SURGICAL SUPPLY — 65 items
ADAPTER IRRIG TUBE 2 SPIKE SOL (ADAPTER) ×6 IMPLANT
ANCHOR SUT BIOC ST 3X145 (Anchor) ×6 IMPLANT
BUR RADIUS 4.0X18.5 (BURR) ×3 IMPLANT
BUR RADIUS 5.5 (BURR) ×3 IMPLANT
CANNULA 5.75X7 CRYSTAL CLEAR (CANNULA) ×6 IMPLANT
CANNULA PARTIAL THREAD 2X7 (CANNULA) ×3 IMPLANT
CANNULA TWIST IN 8.25X9CM (CANNULA) ×6 IMPLANT
CLOSURE WOUND 1/2 X4 (GAUZE/BANDAGES/DRESSINGS) ×1
CONNECTOR PERFECT PASSER (CONNECTOR) IMPLANT
COOLER POLAR GLACIER W/PUMP (MISCELLANEOUS) ×3 IMPLANT
CRADLE LAMINECT ARM (MISCELLANEOUS) ×3 IMPLANT
DRAPE IMP U-DRAPE 54X76 (DRAPES) ×6 IMPLANT
DRAPE INCISE IOBAN 66X45 STRL (DRAPES) ×3 IMPLANT
DRAPE SHEET LG 3/4 BI-LAMINATE (DRAPES) ×3 IMPLANT
DRAPE U-SHAPE 47X51 STRL (DRAPES) ×3 IMPLANT
DURAPREP 26ML APPLICATOR (WOUND CARE) ×9 IMPLANT
ELECT REM PT RETURN 9FT ADLT (ELECTROSURGICAL) ×3
ELECTRODE REM PT RTRN 9FT ADLT (ELECTROSURGICAL) ×1 IMPLANT
GAUZE PETRO XEROFOAM 1X8 (MISCELLANEOUS) ×3 IMPLANT
GAUZE SPONGE 4X4 12PLY STRL (GAUZE/BANDAGES/DRESSINGS) ×3 IMPLANT
GLOVE BIOGEL PI IND STRL 9 (GLOVE) ×1 IMPLANT
GLOVE BIOGEL PI INDICATOR 9 (GLOVE) ×2
GLOVE SURG 9.0 ORTHO LTXF (GLOVE) ×6 IMPLANT
GOWN STRL REUS TWL 2XL XL LVL4 (GOWN DISPOSABLE) ×3 IMPLANT
GOWN STRL REUS W/ TWL LRG LVL3 (GOWN DISPOSABLE) ×1 IMPLANT
GOWN STRL REUS W/TWL LRG LVL3 (GOWN DISPOSABLE) ×2
IV LACTATED RINGER IRRG 3000ML (IV SOLUTION) ×20
IV LR IRRIG 3000ML ARTHROMATIC (IV SOLUTION) ×10 IMPLANT
KIT RM TURNOVER STRD PROC AR (KITS) ×3 IMPLANT
KIT STABILIZATION SHOULDER (MISCELLANEOUS) ×3 IMPLANT
KIT SUTURE 2.8 Q-FIX DISP (MISCELLANEOUS) IMPLANT
KIT SUTURETAK 3.0 INSERT PERC (KITS) ×3 IMPLANT
MANIFOLD NEPTUNE II (INSTRUMENTS) ×3 IMPLANT
MASK FACE SPIDER DISP (MASK) ×3 IMPLANT
MAT BLUE FLOOR 46X72 FLO (MISCELLANEOUS) ×6 IMPLANT
NDL SAFETY 18GX1.5 (NEEDLE) ×3 IMPLANT
NDL SAFETY 22GX1.5 (NEEDLE) ×3 IMPLANT
NS IRRIG 500ML POUR BTL (IV SOLUTION) ×3 IMPLANT
PACK ARTHROSCOPY SHOULDER (MISCELLANEOUS) ×3 IMPLANT
PAD WRAPON POLAR SHDR XLG (MISCELLANEOUS) ×1 IMPLANT
PASSER SUT CAPTURE FIRST (SUTURE) IMPLANT
SET TUBE SUCT SHAVER OUTFL 24K (TUBING) ×3 IMPLANT
SET TUBE TIP INTRA-ARTICULAR (MISCELLANEOUS) ×3 IMPLANT
SLING ULTRA II M (MISCELLANEOUS) ×3 IMPLANT
STRAP SAFETY BODY (MISCELLANEOUS) ×3 IMPLANT
STRIP CLOSURE SKIN 1/2X4 (GAUZE/BANDAGES/DRESSINGS) ×2 IMPLANT
SUT ETHILON 4-0 (SUTURE) ×2
SUT ETHILON 4-0 FS2 18XMFL BLK (SUTURE) ×1
SUT LASSO 90 DEG SD STR (SUTURE) ×3 IMPLANT
SUT MNCRL 4-0 (SUTURE) ×2
SUT MNCRL 4-0 27XMFL (SUTURE) ×1
SUT PDS AB 0 CT1 27 (SUTURE) ×6 IMPLANT
SUT PERFECTPASSER WHITE CART (SUTURE) IMPLANT
SUT VIC AB 0 CT1 36 (SUTURE) ×6 IMPLANT
SUT VIC AB 2-0 CT2 27 (SUTURE) ×3 IMPLANT
SUTURE ETHLN 4-0 FS2 18XMF BLK (SUTURE) ×1 IMPLANT
SUTURE MAGNUM WIRE 2X48 BLK (SUTURE) ×6 IMPLANT
SUTURE MNCRL 4-0 27XMF (SUTURE) ×1 IMPLANT
SYRINGE 10CC LL (SYRINGE) ×3 IMPLANT
TAPE MICROFOAM 4IN (TAPE) ×3 IMPLANT
TUBING ARTHRO INFLOW-ONLY STRL (TUBING) ×3 IMPLANT
TUBING CONNECTING 10 (TUBING) ×2 IMPLANT
TUBING CONNECTING 10' (TUBING) ×1
WAND HAND CNTRL MULTIVAC 90 (MISCELLANEOUS) ×3 IMPLANT
WRAPON POLAR PAD SHDR XLG (MISCELLANEOUS) ×3

## 2016-06-24 NOTE — Op Note (Signed)
06/24/2016  10:42 AM  PATIENT:  Stephen Fry  66 y.o. male  PRE-OPERATIVE DIAGNOSIS:  Superior glenoid labrum lesion of left shoulder, subacromial impingement with bursitis  POST-OPERATIVE DIAGNOSIS:  Same  PROCEDURE:  Procedure(s) with comments: LEFT SHOULDER ARTHROSCOPY WITH SLAP REPAIR with extensive subacromial decompression and debridement  SURGEON:  Surgeon(s) and Role:    * Thornton Park, MD - Primary  ANESTHESIA:   local, general and paracervical block   PREOPERATIVE INDICATIONS:  Stephen Fry is a  66 y.o. male with a diagnosis of superior glenoid labrum lesion (SLAP tear) of left shoulder subacromial impingement and bursitis who failed conservative measures and elected for surgical management.    I discussed the risks and benefits of surgery. The risks include but are not limited to infection, bleeding, nerve or blood vessel injury, joint stiffness or loss of motion, persistent pain, weakness or instability, and hardware failure and the need for further surgery. Medical risks include but are not limited to DVT and pulmonary embolism, myocardial infarction, stroke, pneumonia, respiratory failure and death. Patient understood these risks and wished to proceed.   OPERATIVE IMPLANTS: Arthrex bio suturetak anchors  2   OPERATIVE FINDINGS:  Left shoulder superior labral tear with extensive subacromial bursitis  OPERATIVE PROCEDURE:  I met with the patient in the preoperative area.  I signed the left shoulder according the hospital's correct site of surgery protocol.  I answered all questions by the patient.  Patient underwent a left interscalene block in the preoperative area by the anesthesia service.   He was brought to the operating room and underwent general endotracheal intubation. He was then positioned in a beachchair position. All bony prominences were adequately padded including the lower extremities.  Examination under anesthesia revealed no instability with  load and shift testing, and a negative sulcus sign.  The patient was then prepped and draped in a sterile fashion. The patient received 2 g of Ancef prior to the onset of the case.  A timeout was performed to verify the patient's name, date of birth, medical record number, correct site of surgery and correct procedure to be performed.The timeout was also used to verify the patient received antibiotics that all appropriate instruments, implants and radiographic studies were available in the room. Once all in attendance were in agreement case began.  Bony landmarks were drawn out with a surgical marker along with proposed incisions. These were pre-injected with 1% lidocaine plain. An 11 blade was used to establish a posterior portal through which the arthroscope was placed in the glenohumeral joint. An anterior portal was established under direct visualization using an 18-gauge spinal needle for localization. A 5.75 mm arthroscopic cannula was inserted through the anterior portal. A full diagnostic examination of the glenohumeral joint was performed.  Findings on arthroscopy included fraying of the superior labrum with a concomitant SLAP tear. There is no significant glenohumeral joint arthritis. There was no rotator cuff tear. Subscapularis was intact. The biceps tendon was intact.  An anterolateral portal was established again under direct visualization using an 18-gauge spinal needle. A 7 mm cannula was placed through this anterolateral portal.  The frayed edges of the superior labrum were debrided with a 4.0 mm resector shaver blade. The superior, nonarticular portion of the glenoid, was then prepared with a 4.0 mm resector shaver blade and burr mode.  This debrided the superior glenoid bone until punctate bleeding was identified which will assist in healing of the labrum.  The drill for the Arthrex bio  suturetak was then placed just anterior to the biceps anchor and a hole was drilled.  An Ar[employer]rex [employer] anchor was then placed at this site.   A 90 degree Arthrex suture lasso was then used to shuttle a single limb of this anchor through the superior labrum.  An arthroscopic knot tying technique was then used to approximate the labrum to the glenoid.  A second drill hole was placed just posterior to the biceps tendon. A second Arthrex bio suturetak anchor was placed into this drill hole. A single limb of this anchor was shuttled under superior labrum. Again an arthroscopic knot tying technique was used to fix the superior labrum to the glenoid.  The superior labrum was then probed and had excellent approximation to the glenoid. There was excellent tension on the sutures. Final arthroscopic images of the repair were taken. All arthroscopic incisions were removed.  The arthroscope was then placed into the subacromial space. A lateral portal was established using an 18-gauge spinal needle for localization. Extensive bursitis was encountered. This was debrided using a 4.0 mm resector shaver blade and 90 ArthroCare wand. A subacromial decompression was also performed using a 5.5 mm resector shaver blade.  All bony debris was removed with lavage through the resector shaver blade.   All arthroscopic instruments were removed. The arthroscopic portals were closed with 4-0 nylon. A dry, sterile dressing was applied to the left shoulder, along with a Polar Care sleeve. Patient's left arm was then placed in an abduction sling. He was awoken and brought to PACU in stable condition. I was scrubbed and present for the entire case and all sharp and instrument counts were correct at the conclusion the case. I spoke with the patient's wife in the postop consultation room to let her know the operation was successful and performed without complication.      Timoteo Gaul, MD

## 2016-06-24 NOTE — H&P (Signed)
The patient has been re-examined, and the chart reviewed, and there have been no interval changes to the documented history and physical.    The risks, benefits, and alternatives have been discussed at length, and the patient is willing to proceed.   

## 2016-06-24 NOTE — Anesthesia Post-op Follow-up Note (Cosign Needed)
Anesthesia QCDR form completed.        

## 2016-06-24 NOTE — Anesthesia Preprocedure Evaluation (Signed)
Anesthesia Evaluation  Patient identified by MRN, date of birth, ID band Patient awake    Reviewed: Allergy & Precautions, H&P , NPO status , Patient's Chart, lab work & pertinent test results  History of Anesthesia Complications Negative for: history of anesthetic complications  Airway Mallampati: III  TM Distance: <3 FB Neck ROM: limited    Dental  (+) Chipped, Caps   Pulmonary neg pulmonary ROS, neg shortness of breath,    Pulmonary exam normal breath sounds clear to auscultation       Cardiovascular (-) angina(-) Past MI and (-) DOE negative cardio ROS Normal cardiovascular exam Rhythm:regular Rate:Normal     Neuro/Psych negative neurological ROS  negative psych ROS   GI/Hepatic negative GI ROS, Neg liver ROS, neg GERD  ,  Endo/Other  negative endocrine ROS  Renal/GU      Musculoskeletal  (+) Arthritis ,   Abdominal   Peds  Hematology negative hematology ROS (+)   Anesthesia Other Findings Past Medical History: No date: Arthritis No date: Cancer ([place])     Comment: BASAL CELL X2 No date: Diverticulosis 2018: History of kidney stones     Comment: CURRENTLY HAS STONE BUT SO SMALL NOT CAUSING               ANY PROBLEMS  Past Surgical History: 2013: CHOLECYSTECTOMY 2012: COLON SURGERY     Comment: 1 INCH OF COLON REMOVED DUE TO LARGE POLYP 01/15/2015: COLONOSCOPY WITH PROPOFOL N/A     Comment: Procedure: COLONOSCOPY WITH PROPOFOL;                Surgeon: [name], MD;  Location: [facility]              ENDOSCOPY;  Service: Endoscopy;  Laterality:               N/A;     Reproductive/Obstetrics negative OB ROS                             Anesthesia Physical Anesthesia Plan  ASA: III  Anesthesia Plan: General ETT and Regional   Post-op Pain Management:    Induction:   Airway Management Planned:   Additional Equipment:   Intra-op Plan:   Post-operative Plan:    Informed Consent: I have reviewed the patients History and Physical, chart, labs and discussed the procedure including the risks, benefits and alternatives for the proposed anesthesia with the patient or authorized representative who has indicated his/her understanding and acceptance.   Dental Advisory Given  Plan Discussed with: Anesthesiologist, CRNA and Surgeon  Anesthesia Plan Comments:         Anesthesia Quick Evaluation

## 2016-06-24 NOTE — Discharge Instructions (Signed)

## 2016-06-24 NOTE — Anesthesia Procedure Notes (Signed)
Procedure Name: Intubation Date/Time: 06/24/2016 7:52 AM Performed by: Johnna Acosta Pre-anesthesia Checklist: Patient identified, Emergency Drugs available, Suction available, Patient being monitored and Timeout performed Patient Re-evaluated:Patient Re-evaluated prior to inductionOxygen Delivery Method: Circle system utilized Preoxygenation: Pre-oxygenation with 100% oxygen Intubation Type: IV induction Ventilation: Mask ventilation without difficulty Laryngoscope Size: Miller and 2 Grade View: Grade II Tube type: Oral Tube size: 7.5 mm Number of attempts: 2 Airway Equipment and Method: Bougie stylet Placement Confirmation: ETT inserted through vocal cords under direct vision,  positive ETCO2 and breath sounds checked- equal and bilateral Secured at: 22 cm Tube secured with: Tape Dental Injury: Teeth and Oropharynx as per pre-operative assessment  Difficulty Due To: Difficulty was anticipated and Difficult Airway- due to anterior larynx Future Recommendations: Recommend- induction with short-acting agent, and alternative techniques readily available Comments: Recommend Miller 3

## 2016-06-24 NOTE — Anesthesia Postprocedure Evaluation (Signed)
Anesthesia Post Note  Patient: DELSHON BLANCHFIELD  Procedure(s) Performed: Procedure(s) (LRB): SHOULDER ARTHROSCOPY WITH BANKART REPAIR (Left)  Patient location during evaluation: PACU Anesthesia Type: Regional Level of consciousness: awake and alert Pain management: pain level controlled Vital Signs Assessment: post-procedure vital signs reviewed and stable Respiratory status: spontaneous breathing, nonlabored ventilation, respiratory function stable and patient connected to nasal cannula oxygen Cardiovascular status: blood pressure returned to baseline and stable Postop Assessment: no signs of nausea or vomiting Anesthetic complications: no     Last Vitals:  Vitals:   06/24/16 1130 06/24/16 1142  BP: 114/71 124/69  Pulse: 76 72  Resp: 15 16  Temp: 36.7 C 36.2 C    Last Pain:  Vitals:   06/24/16 1130  TempSrc:   PainSc: 0-No pain                 Precious Haws Piscitello

## 2016-06-24 NOTE — Anesthesia Procedure Notes (Signed)
Anesthesia Regional Block: Interscalene brachial plexus block   Pre-Anesthetic Checklist: ,, timeout performed, Correct Patient, Correct Site, Correct Laterality, Correct Procedure, Correct Position, site marked, Risks and benefits discussed,  Surgical consent,  Pre-op evaluation,  At surgeon's request and post-op pain management  Laterality: Upper and Left  Prep: chloraprep       Needles:  Injection technique: Single-shot  Needle Type: Stimiplex     Needle Length: 5cm  Needle Gauge: 22     Additional Needles:   Procedures: ultrasound guided,,,,,,,,  Narrative:  Start time: 06/24/2016 7:09 AM End time: 06/24/2016 7:11 AM Injection made incrementally with aspirations every 5 mL.  Performed by: Personally  Anesthesiologist: Katy Fitch K  Additional Notes: Patient endorses baseline weakness in left shoulder and arm  Functioning IV was confirmed and monitors were applied.  A 89mm 22ga Stimuplex needle was used. Sterile prep,hand hygiene and sterile gloves were used.  Negative aspiration and negative test dose prior to incremental administration of local anesthetic. The patient tolerated the procedure well with no immediate complications.

## 2016-06-24 NOTE — Transfer of Care (Signed)
Immediate Anesthesia Transfer of Care Note  Patient: Stephen Fry  Procedure(s) Performed: Procedure(s) with comments: SHOULDER ARTHROSCOPY WITH BANKART REPAIR (Left) - LT SHOULDER ARTHROSCOPIC LABRAL REPAIR VS. BICEPS TENOTOMY  Patient Location: PACU  Anesthesia Type:General  Level of Consciousness: sedated  Airway & Oxygen Therapy: Patient Spontanous Breathing and Patient connected to face mask oxygen  Post-op Assessment: Report given to RN and Post -op Vital signs reviewed and stable  Post vital signs: Reviewed and stable  Last Vitals:  Vitals:   06/24/16 0713 06/24/16 0728  BP: 126/73 134/83  Pulse: 71 69  Resp: 12 11  Temp:      Last Pain:  Vitals:   06/24/16 0614  TempSrc: Tympanic  PainSc: 2          Complications: No apparent anesthesia complications

## 2018-01-11 ENCOUNTER — Encounter
Admission: RE | Disposition: A | Discharge status: Home / Self Care | Payer: Self-pay | Source: Ambulatory Visit | Attending: Gastroenterology

## 2018-01-11 ENCOUNTER — Ambulatory Visit: Payer: BLUE CROSS/BLUE SHIELD | Admitting: Anesthesiology

## 2018-01-11 ENCOUNTER — Encounter: Payer: Self-pay | Admitting: Anesthesiology

## 2018-01-11 ENCOUNTER — Ambulatory Visit
Admission: RE | Admit: 2018-01-11 | Discharge: 2018-01-11 | Disposition: A | Discharge status: Home / Self Care | Payer: BLUE CROSS/BLUE SHIELD | Source: Ambulatory Visit | Attending: Gastroenterology | Admitting: Gastroenterology

## 2018-01-11 DIAGNOSIS — Z86010 Personal history of colonic polyps: Secondary | ICD-10-CM | POA: Diagnosis not present

## 2018-01-11 DIAGNOSIS — Z85828 Personal history of other malignant neoplasm of skin: Secondary | ICD-10-CM | POA: Diagnosis not present

## 2018-01-11 DIAGNOSIS — D123 Benign neoplasm of transverse colon: Secondary | ICD-10-CM | POA: Insufficient documentation

## 2018-01-11 DIAGNOSIS — K648 Other hemorrhoids: Secondary | ICD-10-CM | POA: Insufficient documentation

## 2018-01-11 DIAGNOSIS — D128 Benign neoplasm of rectum: Secondary | ICD-10-CM | POA: Insufficient documentation

## 2018-01-11 DIAGNOSIS — K5730 Diverticulosis of large intestine without perforation or abscess without bleeding: Secondary | ICD-10-CM | POA: Diagnosis not present

## 2018-01-11 DIAGNOSIS — Z09 Encounter for follow-up examination after completed treatment for conditions other than malignant neoplasm: Secondary | ICD-10-CM | POA: Diagnosis present

## 2018-01-11 DIAGNOSIS — Z7951 Long term (current) use of inhaled steroids: Secondary | ICD-10-CM | POA: Insufficient documentation

## 2018-01-11 DIAGNOSIS — Z79899 Other long term (current) drug therapy: Secondary | ICD-10-CM | POA: Insufficient documentation

## 2018-01-11 HISTORY — PX: COLONOSCOPY WITH PROPOFOL: SHX5780

## 2018-01-11 SURGERY — COLONOSCOPY WITH PROPOFOL
Anesthesia: General

## 2018-01-11 MED ORDER — PHENYLEPHRINE HCL 10 MG/ML IJ SOLN
INTRAMUSCULAR | Status: DC | PRN
  Administered 2018-01-11 (×2): 100 ug via INTRAVENOUS

## 2018-01-11 MED ORDER — FENTANYL CITRATE (PF) 100 MCG/2ML IJ SOLN
INTRAMUSCULAR | Status: AC
  Filled 2018-01-11: qty 2

## 2018-01-11 MED ORDER — PROPOFOL 10 MG/ML IV BOLUS
INTRAVENOUS | Status: AC
  Filled 2018-01-11: qty 20

## 2018-01-11 MED ORDER — LIDOCAINE 2% (20 MG/ML) 5 ML SYRINGE
INTRAMUSCULAR | Status: DC | PRN
  Administered 2018-01-11: 30 mg via INTRAVENOUS

## 2018-01-11 MED ORDER — PROPOFOL 500 MG/50ML IV EMUL
INTRAVENOUS | Status: AC
  Filled 2018-01-11: qty 50

## 2018-01-11 MED ORDER — PROPOFOL 500 MG/50ML IV EMUL
INTRAVENOUS | Status: DC | PRN
  Administered 2018-01-11: 170 ug/kg/min via INTRAVENOUS

## 2018-01-11 MED ORDER — PROPOFOL 10 MG/ML IV BOLUS
INTRAVENOUS | Status: DC | PRN
  Administered 2018-01-11: 100 mg via INTRAVENOUS

## 2018-01-11 MED ORDER — SODIUM CHLORIDE 0.9 % IV SOLN
INTRAVENOUS | Status: DC
  Administered 2018-01-11: 1000 mL via INTRAVENOUS

## 2018-01-11 MED ORDER — FENTANYL CITRATE (PF) 100 MCG/2ML IJ SOLN
INTRAMUSCULAR | Status: DC | PRN
  Administered 2018-01-11 (×2): 50 ug via INTRAVENOUS

## 2018-01-11 NOTE — Anesthesia Post-op Follow-up Note (Signed)
Anesthesia QCDR form completed.        

## 2018-01-11 NOTE — H&P (Signed)
Outpatient short stay form Pre-procedure 01/11/2018 10:39 AM [name] MD  Primary Physician: [place]  Reason for visit: Colonoscopy  History of present illness: Patient is a 67 year old male presenting today for colonoscopy.  He has personal history of adenomatous colon polyps with his last colonoscopy being on 01/15/2015.  At that time he had 3 adenomas removed.  Tolerated his prep well.  He takes no aspirin or blood thinning agent.    Current Facility-Administered Medications:  .  0.9 %  sodium chloride infusion, , Intravenous, Continuous, [name], MD, Last Rate: 20 mL/hr at 01/11/18 0354  Medications Prior to Admission  Medication Sig Dispense Refill Last Dose  . cetirizine (ZYRTEC) 10 MG tablet Take 10 mg by mouth daily as needed for allergies.   Past Week at Unknown time  . cholecalciferol (VITAMIN D) 1000 units tablet Take 2,000 Units by mouth daily.   Past Week at Unknown time  . fluticasone (FLONASE) 50 MCG/ACT nasal spray Place into both nostrils daily.   Past Week at Unknown time  . ibuprofen (ADVIL,MOTRIN) 200 MG tablet Take 200 mg by mouth 2 (two) times daily as needed for moderate pain.   Past Week at Unknown time  . Melatonin 5 MG TABS Take 5 mg by mouth at bedtime.   Past Week at Unknown time  . Multiple Vitamin (MULTIVITAMIN WITH MINERALS) TABS tablet Take 1 tablet by mouth daily.   Past Week at Unknown time  . omeprazole (PRILOSEC) 20 MG capsule Take 20 mg by mouth daily.   Past Week at Unknown time  . ondansetron (ZOFRAN) 4 MG tablet Take 1 tablet (4 mg total) by mouth every 8 (eight) hours as needed for nausea or vomiting. 30 tablet 0 Past Week at Unknown time  . oxyCODONE (OXY IR/ROXICODONE) 5 MG immediate release tablet Take 1-2 tablets (5-10 mg total) by mouth every 4 (four) hours as needed for severe pain. 60 tablet 0 Past Week at Unknown time     No Known Allergies   Past Medical History:  Diagnosis Date  .  Arthritis   . Cancer ([place])    BASAL CELL X2  . Diverticulosis   . History of kidney stones 2018   CURRENTLY HAS STONE BUT SO SMALL NOT CAUSING ANY PROBLEMS    Review of systems:      Physical Exam    Heart and lungs: Regular rate and rhythm without rub or gallop, lungs are bilaterally clear.    HEENT: Normocephalic atraumatic eyes are anicteric    Other:    Pertinant exam for procedure: Soft nontender nondistended bowel sounds positive normoactive    Planned proceedures: Colonoscopy and indicated procedures. I have discussed the risks benefits and complications of procedures to include not limited to bleeding, infection, perforation and the risk of sedation and the patient wishes to proceed.    [name], MD Gastroenterology 01/11/2018  10:39 AM

## 2018-01-11 NOTE — Anesthesia Preprocedure Evaluation (Signed)
Anesthesia Evaluation  Patient identified by MRN, date of birth, ID band Patient awake    Reviewed: Allergy & Precautions, H&P , NPO status , Patient's Chart, lab work & pertinent test results  History of Anesthesia Complications Negative for: history of anesthetic complications  Airway Mallampati: III  TM Distance: <3 FB Neck ROM: full    Dental  (+) Chipped   Pulmonary neg pulmonary ROS, neg shortness of breath,           Cardiovascular Exercise Tolerance: Good (-) angina(-) Past MI and (-) DOE negative cardio ROS       Neuro/Psych negative neurological ROS  negative psych ROS   GI/Hepatic negative GI ROS, Neg liver ROS, neg GERD  ,  Endo/Other  negative endocrine ROS  Renal/GU negative Renal ROS  negative genitourinary   Musculoskeletal  (+) Arthritis ,   Abdominal   Peds  Hematology negative hematology ROS (+)   Anesthesia Other Findings Past Medical History: No date: Arthritis No date: Cancer ([place])     Comment:  BASAL CELL X2 No date: Diverticulosis 2018: History of kidney stones     Comment:  CURRENTLY HAS STONE BUT SO SMALL NOT CAUSING ANY               PROBLEMS  Past Surgical History: 2013: CHOLECYSTECTOMY 2012: COLON SURGERY     Comment:  1 INCH OF COLON REMOVED DUE TO LARGE POLYP 01/15/2015: COLONOSCOPY WITH PROPOFOL; N/A     Comment:  Procedure: COLONOSCOPY WITH PROPOFOL;  Surgeon: [name], MD;  Location: [facility] ENDOSCOPY;  Service:               Endoscopy;  Laterality: N/A; 06/24/2016: SHOULDER ARTHROSCOPY WITH BANKART REPAIR; Left     Comment:  Procedure: SHOULDER ARTHROSCOPY WITH BANKART REPAIR;                Surgeon: Thornton Park, MD;  Location: [facility] ORS;                Service: Orthopedics;  Laterality: Left;  LT SHOULDER               ARTHROSCOPIC LABRAL REPAIR VS. BICEPS TENOTOMY  BMI    Body Mass Index:  23.19 kg/m      Reproductive/Obstetrics negative  OB ROS                             Anesthesia Physical Anesthesia Plan  ASA: II  Anesthesia Plan: General   Post-op Pain Management:    Induction: Intravenous  PONV Risk Score and Plan: Propofol infusion and TIVA  Airway Management Planned: Natural Airway and Nasal Cannula  Additional Equipment:   Intra-op Plan:   Post-operative Plan:   Informed Consent: I have reviewed the patients History and Physical, chart, labs and discussed the procedure including the risks, benefits and alternatives for the proposed anesthesia with the patient or authorized representative who has indicated his/her understanding and acceptance.   Dental Advisory Given  Plan Discussed with: Anesthesiologist, CRNA and Surgeon  Anesthesia Plan Comments: (Patient consented for risks of anesthesia including but not limited to:  - adverse reactions to medications - risk of intubation if required - damage to teeth, lips or other oral mucosa - sore throat or hoarseness - Damage to heart, brain, lungs or loss of life  Patient voiced understanding.)  Anesthesia Quick Evaluation  

## 2018-01-11 NOTE — Op Note (Signed)
[facility] Gastroenterology Patient Name: Stephen Fry Procedure Date: 01/11/2018 10:37 AM MRN: [id number] Account #: [id number] Date of Birth: [birth year] Admit Type: Outpatient Age: 67 Room: [facility] ENDO ROOM 1 Gender: Male Note Status: Finalized Procedure:            Colonoscopy Indications:          Personal history of colonic polyps Providers:            [name], MD Referring MD:         [facility] (Referring MD) Medicines:            Monitored Anesthesia Care Complications:        No immediate complications. Procedure:            Pre-Anesthesia Assessment:                       - ASA Grade Assessment: II - A patient with mild                        systemic disease.                       After obtaining informed consent, the colonoscope was                        passed under direct vision. Throughout the procedure,                        the patient's blood pressure, pulse, and oxygen                        saturations were monitored continuously. The                        Colonoscope was introduced through the anus and                        advanced to the the cecum, identified by appendiceal                        orifice and ileocecal valve. The colonoscopy was                        performed without difficulty. The patient tolerated the                        procedure well. The quality of the bowel preparation                        was good. Findings:      A 6 mm polyp was found in the hepatic flexure. The polyp was sessile.       Polypectomy was attempted, initially using a cold snare. Polyp resection       was incomplete with this device. This intervention then required a       different device and polypectomy technique. The polyp was removed with a       cold biopsy forceps. Resection and retrieval were complete.      A 3 mm polyp was found in the rectum. The polyp was sessile. The polyp       was  removed  with a cold biopsy forceps. Resection and retrieval were       complete.      Multiple small-mouthed diverticula were found in the sigmoid colon and       descending colon.      Non-bleeding internal hemorrhoids were found during retroflexion and       during anoscopy. The hemorrhoids were small.      No additional abnormalities were found on retroflexion.      The digital rectal exam was normal. Impression:           - One 6 mm polyp at the hepatic flexure, removed with a                        cold biopsy forceps. Resected and retrieved.                       - One 3 mm polyp in the rectum, removed with a cold                        biopsy forceps. Resected and retrieved.                       - Diverticulosis in the sigmoid colon and in the                        descending colon.                       - Non-bleeding internal hemorrhoids. Recommendation:       - Discharge patient to home. Procedure Code(s):    --- Professional ---                       [phone], Colonoscopy, flexible; with biopsy, single or                        multiple Diagnosis Code(s):    --- Professional ---                       D12.3, Benign neoplasm of transverse colon (hepatic                        flexure or splenic flexure)                       K62.1, Rectal polyp                       K64.8, Other hemorrhoids                       Z86.010, Personal history of colonic polyps                       K57.30, Diverticulosis of large intestine without                        perforation or abscess without bleeding CPT copyright 2018 American Medical Association. All rights reserved. The codes documented in this report are preliminary and upon coder review may  be revised to meet current compliance requirements. [name], MD 01/11/2018 11:32:46 AM This report has been signed electronically. Number  of Addenda: 0 Note Initiated On: 01/11/2018 10:37 AM Scope Withdrawal Time: 0 hours 9 minutes 59 seconds   Total Procedure Duration: 0 hours 27 minutes 45 seconds       [facility]

## 2018-01-11 NOTE — Anesthesia Postprocedure Evaluation (Signed)
Anesthesia Post Note  Patient: Stephen Fry  Procedure(s) Performed: COLONOSCOPY WITH PROPOFOL (N/A )  Patient location during evaluation: Endoscopy Anesthesia Type: General Level of consciousness: awake and alert Pain management: pain level controlled Vital Signs Assessment: post-procedure vital signs reviewed and stable Respiratory status: spontaneous breathing, nonlabored ventilation, respiratory function stable and patient connected to nasal cannula oxygen Cardiovascular status: blood pressure returned to baseline and stable Postop Assessment: no apparent nausea or vomiting Anesthetic complications: no     Last Vitals:  Vitals:   01/11/18 1143 01/11/18 1153  BP: (!) 100/55 122/70  Pulse: 84 81  Resp: 16 12  Temp:    SpO2:  100%    Last Pain:  Vitals:   01/11/18 1153  TempSrc:   PainSc: 0-No pain                 Precious Haws Piscitello

## 2018-01-11 NOTE — Transfer of Care (Signed)
Immediate Anesthesia Transfer of Care Note  Patient: Stephen Fry  Procedure(s) Performed: COLONOSCOPY WITH PROPOFOL (N/A )  Patient Location: PACU and Endoscopy Unit  Anesthesia Type:General  Level of Consciousness: drowsy  Airway & Oxygen Therapy: Patient Spontanous Breathing and Patient connected to nasal cannula oxygen  Post-op Assessment: Report given to RN and Post -op Vital signs reviewed and stable  Post vital signs: Reviewed and stable  Last Vitals:  Vitals Value Taken Time  BP    Temp    Pulse    Resp    SpO2      Last Pain:  Vitals:   01/11/18 0936  TempSrc: Tympanic  PainSc: 0-No pain         Complications: No apparent anesthesia complications

## 2018-01-12 ENCOUNTER — Encounter: Payer: Self-pay | Admitting: Gastroenterology

## 2018-01-12 LAB — SURGICAL PATHOLOGY

## 2018-06-30 DIAGNOSIS — Z85828 Personal history of other malignant neoplasm of skin: Secondary | ICD-10-CM

## 2019-11-23 ENCOUNTER — Ambulatory Visit: Payer: BLUE CROSS/BLUE SHIELD | Admitting: Dermatology

## 2020-01-16 ENCOUNTER — Encounter: Payer: Self-pay | Admitting: Dermatology

## 2020-01-16 ENCOUNTER — Other Ambulatory Visit: Payer: Self-pay

## 2020-01-16 ENCOUNTER — Ambulatory Visit (INDEPENDENT_AMBULATORY_CARE_PROVIDER_SITE_OTHER): Payer: Medicare HMO | Admitting: Dermatology

## 2020-01-16 DIAGNOSIS — L814 Other melanin hyperpigmentation: Secondary | ICD-10-CM | POA: Diagnosis not present

## 2020-01-16 DIAGNOSIS — L570 Actinic keratosis: Secondary | ICD-10-CM | POA: Diagnosis not present

## 2020-01-16 DIAGNOSIS — Z1283 Encounter for screening for malignant neoplasm of skin: Secondary | ICD-10-CM | POA: Diagnosis not present

## 2020-01-16 DIAGNOSIS — Z85828 Personal history of other malignant neoplasm of skin: Secondary | ICD-10-CM

## 2020-01-16 DIAGNOSIS — D229 Melanocytic nevi, unspecified: Secondary | ICD-10-CM

## 2020-01-16 DIAGNOSIS — L821 Other seborrheic keratosis: Secondary | ICD-10-CM | POA: Diagnosis not present

## 2020-01-16 DIAGNOSIS — L820 Inflamed seborrheic keratosis: Secondary | ICD-10-CM

## 2020-01-16 DIAGNOSIS — D1800 Hemangioma unspecified site: Secondary | ICD-10-CM

## 2020-01-16 DIAGNOSIS — I878 Other specified disorders of veins: Secondary | ICD-10-CM

## 2020-01-16 DIAGNOSIS — L817 Pigmented purpuric dermatosis: Secondary | ICD-10-CM

## 2020-01-16 DIAGNOSIS — L578 Other skin changes due to chronic exposure to nonionizing radiation: Secondary | ICD-10-CM

## 2020-01-16 NOTE — Progress Notes (Signed)
Follow-Up Visit   Subjective  Stephen Fry is a 69 y.o. male who presents for the following: Annual Exam (History of AK and BCC - TBSE today) and Other (SPot of left cheek that has been treated with Picato in the past. Spot of scalp that is scaly.). The patient presents for Total-Body Skin Exam (TBSE) for skin cancer screening and mole check.  The following portions of the chart were reviewed this encounter and updated as appropriate:  Tobacco  Allergies  Meds  Problems  Med Hx  Surg Hx  Fam Hx     Review of Systems:  No other skin or systemic complaints except as noted in HPI or Assessment and Plan.  Objective  Well appearing patient in no apparent distress; mood and affect are within normal limits.  A full examination was performed including scalp, head, eyes, ears, nose, lips, neck, chest, axillae, abdomen, back, buttocks, bilateral upper extremities, bilateral lower extremities, hands, feet, fingers, toes, fingernails, and toenails. All findings within normal limits unless otherwise noted below.  Objective  Face (2): Erythematous thin papules/macules with gritty scale.   Objective  Scalp: Erythematous keratotic or waxy stuck-on papule or plaque.    Assessment & Plan    Lentigines - Scattered tan macules - Discussed due to sun exposure - Benign, observe - Call for any changes  Seborrheic Keratoses - Stuck-on, waxy, tan-brown papules and plaques  - Discussed benign etiology and prognosis. - Observe - Call for any changes  Melanocytic Nevi - Tan-brown and/or pink-flesh-colored symmetric macules and papules - Benign appearing on exam today - Observation - Call clinic for new or changing moles - Recommend daily use of broad spectrum spf 30+ sunscreen to sun-exposed areas.   Hemangiomas - Red papules - Discussed benign nature - Observe - Call for any changes  Actinic Damage - diffuse scaly erythematous macules with underlying dyspigmentation -  Recommend daily broad spectrum sunscreen SPF 30+ to sun-exposed areas, reapply every 2 hours as needed.  - Call for new or changing lesions.  Skin cancer screening performed today.  History of Basal Cell Carcinoma of the Skin - No evidence of recurrence today - Recommend regular full body skin exams - Recommend daily broad spectrum sunscreen SPF 30+ to sun-exposed areas, reapply every 2 hours as needed.  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 Recommend graduated compression stockings - knee high if desired or if symptomatic.  AK (actinic keratosis) (2) Face    Destruction of lesion - Face Complexity: simple   Destruction method: cryotherapy   Informed consent: discussed and consent obtained   Timeout:  patient name, date of birth, surgical site, and procedure verified Lesion destroyed using liquid nitrogen: Yes   Region frozen until ice ball extended beyond lesion: Yes   Outcome: patient tolerated procedure well with no complications   Post-procedure details: wound care instructions given    Inflamed seborrheic keratosis Scalp  Destruction of lesion - Scalp Complexity: simple   Destruction method: cryotherapy   Informed consent: discussed and consent obtained   Timeout:  patient name, date of birth, surgical site, and procedure verified Lesion destroyed using liquid nitrogen: Yes   Region frozen until ice ball extended beyond lesion: Yes   Outcome: patient tolerated procedure well with no complications   Post-procedure details: wound care instructions given    Skin cancer screening  Return in about 3 months (around 04/17/2020) for  AK follow up and 1 year for TBSE.  I, Ashok Cordia, CMA, am acting as scribe for Sarina Ser, MD .  Documentation: I have reviewed the above documentation for accuracy and completeness, and I agree with the above.  Sarina Ser, MD

## 2020-01-16 NOTE — Patient Instructions (Addendum)

## 2020-05-03 ENCOUNTER — Other Ambulatory Visit: Payer: Self-pay

## 2020-05-03 ENCOUNTER — Ambulatory Visit: Payer: Medicare HMO | Admitting: Dermatology

## 2020-05-03 DIAGNOSIS — L578 Other skin changes due to chronic exposure to nonionizing radiation: Secondary | ICD-10-CM | POA: Diagnosis not present

## 2020-05-03 DIAGNOSIS — L570 Actinic keratosis: Secondary | ICD-10-CM

## 2020-05-03 DIAGNOSIS — L821 Other seborrheic keratosis: Secondary | ICD-10-CM

## 2020-05-03 DIAGNOSIS — L820 Inflamed seborrheic keratosis: Secondary | ICD-10-CM | POA: Diagnosis not present

## 2020-05-03 DIAGNOSIS — L738 Other specified follicular disorders: Secondary | ICD-10-CM

## 2020-05-03 NOTE — Progress Notes (Signed)
   Follow-Up Visit   Subjective  Stephen Fry is a 70 y.o. male who presents for the following: Actinic Keratosis (3 month follow up of left cheek treated with LN2).  The following portions of the chart were reviewed this encounter and updated as appropriate:   Tobacco  Allergies  Meds  Problems  Med Hx  Surg Hx  Fam Hx     Review of Systems:  No other skin or systemic complaints except as noted in HPI or Assessment and Plan.  Objective  Well appearing patient in no apparent distress; mood and affect are within normal limits.  A focused examination was performed including face. Relevant physical exam findings are noted in the Assessment and Plan.  Objective  Face: Yellow papules  Objective  Left zygoma x 1, left brow x 1 (2): Erythematous thin papules/macules with gritty scale.   Objective  Face: Erythematous keratotic or waxy stuck-on papule or plaque.     Assessment & Plan    Actinic Damage - chronic, secondary to cumulative UV radiation exposure/sun exposure over time - diffuse scaly erythematous macules with underlying dyspigmentation - Recommend daily broad spectrum sunscreen SPF 30+ to sun-exposed areas, reapply every 2 hours as needed.  - Call for new or changing lesions.  Sebaceous hyperplasia Face  Benign, observe.    AK (actinic keratosis) (2) Left zygoma x 1, left brow x 1  Destruction of lesion - Left zygoma x 1, left brow x 1 Complexity: simple   Destruction method: cryotherapy   Informed consent: discussed and consent obtained   Timeout:  patient name, date of birth, surgical site, and procedure verified Lesion destroyed using liquid nitrogen: Yes   Region frozen until ice ball extended beyond lesion: Yes   Outcome: patient tolerated procedure well with no complications   Post-procedure details: wound care instructions given    Inflamed seborrheic keratosis Face  Destruction of lesion - Face Complexity: simple   Destruction  method: cryotherapy   Informed consent: discussed and consent obtained   Timeout:  patient name, date of birth, surgical site, and procedure verified Lesion destroyed using liquid nitrogen: Yes   Region frozen until ice ball extended beyond lesion: Yes   Outcome: patient tolerated procedure well with no complications   Post-procedure details: wound care instructions given    Return for Follow up as scheduled, TBSE.   I, Ashok Cordia, CMA, am acting as scribe for Sarina Ser, MD .  Documentation: I have reviewed the above documentation for accuracy and completeness, and I agree with the above.  Sarina Ser, MD

## 2020-05-03 NOTE — Patient Instructions (Signed)

## 2020-05-06 ENCOUNTER — Encounter: Payer: Self-pay | Admitting: Dermatology

## 2021-01-16 ENCOUNTER — Other Ambulatory Visit: Payer: Self-pay

## 2021-01-16 ENCOUNTER — Ambulatory Visit: Payer: Medicare HMO | Admitting: Dermatology

## 2021-01-16 DIAGNOSIS — D1800 Hemangioma unspecified site: Secondary | ICD-10-CM

## 2021-01-16 DIAGNOSIS — L814 Other melanin hyperpigmentation: Secondary | ICD-10-CM

## 2021-01-16 DIAGNOSIS — D229 Melanocytic nevi, unspecified: Secondary | ICD-10-CM

## 2021-01-16 DIAGNOSIS — Z1283 Encounter for screening for malignant neoplasm of skin: Secondary | ICD-10-CM

## 2021-01-16 DIAGNOSIS — Z85828 Personal history of other malignant neoplasm of skin: Secondary | ICD-10-CM

## 2021-01-16 DIAGNOSIS — L720 Epidermal cyst: Secondary | ICD-10-CM | POA: Diagnosis not present

## 2021-01-16 DIAGNOSIS — L821 Other seborrheic keratosis: Secondary | ICD-10-CM

## 2021-01-16 DIAGNOSIS — L578 Other skin changes due to chronic exposure to nonionizing radiation: Secondary | ICD-10-CM | POA: Diagnosis not present

## 2021-01-16 DIAGNOSIS — L570 Actinic keratosis: Secondary | ICD-10-CM | POA: Diagnosis not present

## 2021-01-16 NOTE — Progress Notes (Addendum)
Follow-Up Visit   Subjective  Stephen Fry is a 70 y.o. male who presents for the following: Annual Exam (Mole check ).  He has a spot on his eyelid he would like checked The patient presents for Total-Body Skin Exam (TBSE) for skin cancer screening and mole check.   The following portions of the chart were reviewed this encounter and updated as appropriate:   Tobacco  Allergies  Meds  Problems  Med Hx  Surg Hx  Fam Hx     Review of Systems:  No other skin or systemic complaints except as noted in HPI or Assessment and Plan.  Objective  Well appearing patient in no apparent distress; mood and affect are within normal limits.  A full examination was performed including scalp, head, eyes, ears, nose, lips, neck, chest, axillae, abdomen, back, buttocks, bilateral upper extremities, bilateral lower extremities, hands, feet, fingers, toes, fingernails, and toenails. All findings within normal limits unless otherwise noted below.  right corner of mouth Smooth white papule(s).   left cheek infraorbitial 2.5 cm inferior to the left mid to lateral eyelash margin Erythematous thin papules/macules with gritty scale.    Assessment & Plan  Milia right corner of mouth  Benign-appearing.  Observation.  Call clinic for new or changing moles.  Recommend daily use of broad spectrum spf 30+ sunscreen to sun-exposed areas.    AK (actinic keratosis) left cheek infraorbitial 2.5 cm inferior to the left mid to lateral eyelash margin  Actinic keratoses are precancerous spots that appear secondary to cumulative UV radiation exposure/sun exposure over time. They are chronic with expected duration over 1 year. A portion of actinic keratoses will progress to squamous cell carcinoma of the skin. It is not possible to reliably predict which spots will progress to skin cancer and so treatment is recommended to prevent development of skin cancer.  Recommend daily broad spectrum sunscreen SPF 30+  to sun-exposed areas, reapply every 2 hours as needed.  Recommend staying in the shade or wearing long sleeves, sun glasses (UVA+UVB protection) and wide brim hats (4-inch brim around the entire circumference of the hat). Call for new or changing lesions.   Prior to procedure, discussed risks of blister formation, small wound, skin dyspigmentation, or rare scar following cryotherapy. Recommend Vaseline ointment to treated areas while healing.   If not gone in 2 months return to the office may consider biopsy  Destruction of lesion - left cheek infraorbitial 2.5 cm inferior to the left mid to lateral eyelash margin Complexity: simple   Destruction method: cryotherapy   Informed consent: discussed and consent obtained   Timeout:  patient name, date of birth, surgical site, and procedure verified Lesion destroyed using liquid nitrogen: Yes   Region frozen until ice ball extended beyond lesion: Yes   Outcome: patient tolerated procedure well with no complications   Post-procedure details: wound care instructions given    Skin cancer screening  Lentigines - Scattered tan macules - Due to sun exposure - Benign-appearing, observe - Recommend daily broad spectrum sunscreen SPF 30+ to sun-exposed areas, reapply every 2 hours as needed. - Call for any changes  Seborrheic Keratoses - Stuck-on, waxy, tan-brown papules and/or plaques  - Benign-appearing - Discussed benign etiology and prognosis. - Observe - Call for any changes  Melanocytic Nevi - Tan-brown and/or pink-flesh-colored symmetric macules and papules - Benign appearing on exam today - Observation - Call clinic for new or changing moles - Recommend daily use of broad spectrum spf 30+ sunscreen  to sun-exposed areas.   Hemangiomas - Red papules - Discussed benign nature - Observe - Call for any changes  Actinic Damage - Chronic condition, secondary to cumulative UV/sun exposure - diffuse scaly erythematous macules with  underlying dyspigmentation - Recommend daily broad spectrum sunscreen SPF 30+ to sun-exposed areas, reapply every 2 hours as needed.  - Staying in the shade or wearing long sleeves, sun glasses (UVA+UVB protection) and wide brim hats (4-inch brim around the entire circumference of the hat) are also recommended for sun protection.  - Call for new or changing lesions.  History of Basal Cell Carcinoma of the Skin Right forehead 2015  - No evidence of recurrence today - Recommend regular full body skin exams - Recommend daily broad spectrum sunscreen SPF 30+ to sun-exposed areas, reapply every 2 hours as needed.  - Call if any new or changing lesions are noted between office visits   Skin cancer screening performed today.   Return in about 1 year (around 01/16/2022) for TBSE, hx of BCC, hx of AKs .  IMarye Round, CMA, am acting as scribe for Sarina Ser, MD .  Documentation: I have reviewed the above documentation for accuracy and completeness, and I agree with the above.  Sarina Ser, MD

## 2021-01-16 NOTE — Patient Instructions (Addendum)
Cryotherapy Aftercare  Wash gently with soap and water everyday.   Apply Vaseline and Band-Aid daily until healed.  Prior to procedure, discussed risks of blister formation, small wound, skin dyspigmentation, or rare scar following cryotherapy. Recommend Vaseline ointment to treated areas while healing.    If you have any questions or concerns for your doctor, please call our main line at [phone] and press option 4 to reach your doctor's medical assistant. If no one answers, please leave a voicemail as directed and we will return your call as soon as possible. Messages left after 4 pm will be answered the following business day.   You may also send us a message via MyChart. We typically respond to MyChart messages within 1-2 business days.  For prescription refills, please ask your pharmacy to contact our office. Our fax number is [phone].  If you have an urgent issue when the clinic is closed that cannot wait until the next business day, you can page your doctor at the number below.    Please note that while we do our best to be available for urgent issues outside of office hours, we are not available 24/7.   If you have an urgent issue and are unable to reach us, you may choose to seek medical care at your doctor's office, retail clinic, urgent care center, or emergency room.  If you have a medical emergency, please immediately call 911 or go to the emergency department.  Pager Numbers  - Dr. Kowalski: [phone]  - Dr. Moye: [phone]  - Dr. Stewart: [phone]  In the event of inclement weather, please call our main line at [phone] for an update on the status of any delays or closures.  Dermatology Medication Tips: Please keep the boxes that topical medications come in in order to help keep track of the instructions about where and how to use these. Pharmacies typically print the medication instructions only on the boxes and not directly on the medication  tubes.   If your medication is too expensive, please contact our office at [phone] option 4 or send us a message through MyChart.   We are unable to tell what your co-pay for medications will be in advance as this is different depending on your insurance coverage. However, we may be able to find a substitute medication at lower cost or fill out paperwork to get insurance to cover a needed medication.   If a prior authorization is required to get your medication covered by your insurance company, please allow us 1-2 business days to complete this process.  Drug prices often vary depending on where the prescription is filled and some pharmacies may offer cheaper prices.  The website www.goodrx.com contains coupons for medications through different pharmacies. The prices here do not account for what the cost may be with help from insurance (it may be cheaper with your insurance), but the website can give you the price if you did not use any insurance.  - You can print the associated coupon and take it with your prescription to the pharmacy.  - You may also stop by our office during regular business hours and pick up a GoodRx coupon card.  - If you need your prescription sent electronically to a different pharmacy, notify our office through [place] MyChart or by phone at [phone] option 4.  

## 2021-01-17 ENCOUNTER — Encounter: Payer: Self-pay | Admitting: Dermatology

## 2021-08-21 ENCOUNTER — Ambulatory Visit: Payer: Medicare HMO | Admitting: Dermatology

## 2021-08-21 DIAGNOSIS — D485 Neoplasm of uncertain behavior of skin: Secondary | ICD-10-CM | POA: Diagnosis not present

## 2021-08-21 DIAGNOSIS — L570 Actinic keratosis: Secondary | ICD-10-CM

## 2021-08-21 DIAGNOSIS — L820 Inflamed seborrheic keratosis: Secondary | ICD-10-CM

## 2021-08-21 DIAGNOSIS — D489 Neoplasm of uncertain behavior, unspecified: Secondary | ICD-10-CM

## 2021-08-21 DIAGNOSIS — L578 Other skin changes due to chronic exposure to nonionizing radiation: Secondary | ICD-10-CM

## 2021-08-21 NOTE — Patient Instructions (Addendum)
Biopsy Wound Care Instructions  Leave the original bandage on for 24 hours if possible.  If the bandage becomes soaked or soiled before that time, it is OK to remove it and examine the wound.  A small amount of post-operative bleeding is normal.  If excessive bleeding occurs, remove the bandage, place gauze over the site and apply continuous pressure (no peeking) over the area for 30 minutes. If this does not work, please call our clinic as soon as possible or page your doctor if it is after hours.   Once a day, cleanse the wound with soap and water. It is fine to shower. If a thick crust develops you may use a Q-tip dipped into dilute hydrogen peroxide (mix 1:1 with water) to dissolve it.  Hydrogen peroxide can slow the healing process, so use it only as needed.    After washing, apply petroleum jelly (Vaseline) or an antibiotic ointment if your doctor prescribed one for you, followed by a bandage.    For best healing, the wound should be covered with a layer of ointment at all times. If you are not able to keep the area covered with a bandage to hold the ointment in place, this may mean re-applying the ointment several times a day.  Continue this wound care until the wound has healed and is no longer open.   Itching and mild discomfort is normal during the healing process. However, if you develop pain or severe itching, please call our office.   If you have any discomfort, you can take Tylenol (acetaminophen) or ibuprofen as directed on the bottle. (Please do not take these if you have an allergy to them or cannot take them for another reason).  Some redness, tenderness and white or yellow material in the wound is normal healing.  If the area becomes very sore and red, or develops a thick yellow-green material (pus), it may be infected; please notify us.    If you have stitches, return to clinic as directed to have the stitches removed. You will continue wound care for 2-3 days after the stitches  are removed.   Wound healing continues for up to one year following surgery. It is not unusual to experience pain in the scar from time to time during the interval.  If the pain becomes severe or the scar thickens, you should notify the office.    A slight amount of redness in a scar is expected for the first six months.  After six months, the redness will fade and the scar will soften and fade.  The color difference becomes less noticeable with time.  If there are any problems, return for a post-op surgery check at your earliest convenience.  To improve the appearance of the scar, you can use silicone scar gel, cream, or sheets (such as Mederma or Serica) every night for up to one year. These are available over the counter (without a prescription).  Please call our office at [phone] for any questions or concerns.  Electrodesiccation and Curettage ("Scrape and Burn") Wound Care Instructions  Leave the original bandage on for 24 hours if possible.  If the bandage becomes soaked or soiled before that time, it is OK to remove it and examine the wound.  A small amount of post-operative bleeding is normal.  If excessive bleeding occurs, remove the bandage, place gauze over the site and apply continuous pressure (no peeking) over the area for 30 minutes. If this does not work, please call our clinic  as soon as possible or page your doctor if it is after hours.   Once a day, cleanse the wound with soap and water. It is fine to shower. If a thick crust develops you may use a Q-tip dipped into dilute hydrogen peroxide (mix 1:1 with water) to dissolve it.  Hydrogen peroxide can slow the healing process, so use it only as needed.    After washing, apply petroleum jelly (Vaseline) or an antibiotic ointment if your doctor prescribed one for you, followed by a bandage.    For best healing, the wound should be covered with a layer of ointment at all times. If you are not able to keep the area covered with a  bandage to hold the ointment in place, this may mean re-applying the ointment several times a day.  Continue this wound care until the wound has healed and is no longer open. It may take several weeks for the wound to heal and close.  Itching and mild discomfort is normal during the healing process.  If you have any discomfort, you can take Tylenol (acetaminophen) or ibuprofen as directed on the bottle. (Please do not take these if you have an allergy to them or cannot take them for another reason).  Some redness, tenderness and white or yellow material in the wound is normal healing.  If the area becomes very sore and red, or develops a thick yellow-green material (pus), it may be infected; please notify us.    Wound healing continues for up to one year following surgery. It is not unusual to experience pain in the scar from time to time during the interval.  If the pain becomes severe or the scar thickens, you should notify the office.    A slight amount of redness in a scar is expected for the first six months.  After six months, the redness will fade and the scar will soften and fade.  The color difference becomes less noticeable with time.  If there are any problems, return for a post-op surgery check at your earliest convenience.  To improve the appearance of the scar, you can use silicone scar gel, cream, or sheets (such as Mederma or Serica) every night for up to one year. These are available over the counter (without a prescription).  Please call our office at [phone] for any questions or concerns.      Actinic keratoses are precancerous spots that appear secondary to cumulative UV radiation exposure/sun exposure over time. They are chronic with expected duration over 1 year. A portion of actinic keratoses will progress to squamous cell carcinoma of the skin. It is not possible to reliably predict which spots will progress to skin cancer and so treatment is recommended to prevent  development of skin cancer.  Recommend daily broad spectrum sunscreen SPF 30+ to sun-exposed areas, reapply every 2 hours as needed.  Recommend staying in the shade or wearing long sleeves, sun glasses (UVA+UVB protection) and wide brim hats (4-inch brim around the entire circumference of the hat). Call for new or changing lesions.   Cryotherapy Aftercare  Wash gently with soap and water everyday.   Apply Vaseline and Band-Aid daily until healed.    Seborrheic Keratosis  What causes seborrheic keratoses? Seborrheic keratoses are harmless, common skin growths that first appear during adult life.  As time goes by, more growths appear.  Some people may develop a large number of them.  Seborrheic keratoses appear on both covered and uncovered body parts.  They  are not caused by sunlight.  The tendency to develop seborrheic keratoses can be inherited.  They vary in color from skin-colored to gray, brown, or even black.  They can be either smooth or have a rough, warty surface.   Seborrheic keratoses are superficial and look as if they were stuck on the skin.  Under the microscope this type of keratosis looks like layers upon layers of skin.  That is why at times the top layer may seem to fall off, but the rest of the growth remains and re-grows.    Treatment Seborrheic keratoses do not need to be treated, but can easily be removed in the office.  Seborrheic keratoses often cause symptoms when they rub on clothing or jewelry.  Lesions can be in the way of shaving.  If they become inflamed, they can cause itching, soreness, or burning.  Removal of a seborrheic keratosis can be accomplished by freezing, burning, or surgery. If any spot bleeds, scabs, or grows rapidly, please return to have it checked, as these can be an indication of a skin cancer.      Due to recent changes in healthcare laws, you may see results of your pathology and/or laboratory studies on MyChart before the doctors have had a  chance to review them. We understand that in some cases there may be results that are confusing or concerning to you. Please understand that not all results are received at the same time and often the doctors may need to interpret multiple results in order to provide you with the best plan of care or course of treatment. Therefore, we ask that you please give [place] 2 business days to thoroughly review all your results before contacting the office for clarification. Should we see a critical lab result, you will be contacted sooner.   If You Need Anything After Your Visit  If you have any questions or concerns for your doctor, please call our main line at [phone] and press option 4 to reach your doctor's medical assistant. If no one answers, please leave a voicemail as directed and we will return your call as soon as possible. Messages left after 4 pm will be answered the following business day.   You may also send [place] a message via [place]. We typically respond to MyChart messages within 1-2 business days.  For prescription refills, please ask your pharmacy to contact our office. Our fax number is [phone].  If you have an urgent issue when the clinic is closed that cannot wait until the next business day, you can page your doctor at the number below.    Please note that while we do our best to be available for urgent issues outside of office hours, we are not available 24/7.   If you have an urgent issue and are unable to reach [place], you may choose to seek medical care at your doctor's office, retail clinic, urgent care center, or emergency room.  If you have a medical emergency, please immediately call 911 or go to the emergency department.  Pager Numbers  - Dr. Nehemiah Massed: [phone]  - Dr. Laurence Ferrari: [phone]  - Dr. Nicole Kindred: [phone]  In the event of inclement weather, please call our main line at [phone] for an update on the status of any delays or closures.  Dermatology  Medication Tips: Please keep the boxes that topical medications come in in order to help keep track of the instructions about where and how to use these. Pharmacies typically print the medication  instructions only on the boxes and not directly on the medication tubes.   If your medication is too expensive, please contact our office at [phone] option 4 or send [place] a message through [place].   We are unable to tell what your co-pay for medications will be in advance as this is different depending on your insurance coverage. However, we may be able to find a substitute medication at lower cost or fill out paperwork to get insurance to cover a needed medication.   If a prior authorization is required to get your medication covered by your insurance company, please allow [place] 1-2 business days to complete this process.  Drug prices often vary depending on where the prescription is filled and some pharmacies may offer cheaper prices.  The website www.goodrx.com contains coupons for medications through different pharmacies. The prices here do not account for what the cost may be with help from insurance (it may be cheaper with your insurance), but the website can give you the price if you did not use any insurance.  - You can print the associated coupon and take it with your prescription to the pharmacy.  - You may also stop by our office during regular business hours and pick up a GoodRx coupon card.  - If you need your prescription sent electronically to a different pharmacy, notify our office through [facility] or by phone at [phone] option 4.     Si Usted Necesita Algo Despus de Su Visita  Tambin puede enviarnos un mensaje a travs de Pharmacist, community. Por lo general respondemos a los mensajes de MyChart en el transcurso de 1 a 2 das hbiles.  Para renovar recetas, por favor pida a su farmacia que se ponga en contacto con nuestra oficina. Harland Dingwall de fax es [place] [phone].  Si  tiene un asunto urgente cuando la clnica est cerrada y que no puede esperar hasta el siguiente da hbil, puede llamar/localizar a su doctor(a) al nmero que aparece a continuacin.   Por favor, tenga en cuenta que aunque hacemos todo lo posible para estar disponibles para asuntos urgentes fuera del horario de [place], no estamos disponibles las 24 horas del da, los 7 das de la [place].   Si tiene un problema urgente y no puede comunicarse con nosotros, puede optar por buscar atencin mdica  en el consultorio de su doctor(a), en una clnica privada, en un centro de atencin urgente o en una sala de emergencias.  Si tiene Engineering geologist, por favor llame inmediatamente al 911 o vaya a la sala de emergencias.  Nmeros de bper  - Dr. Nehemiah Massed: [phone]  - Dra. Moye: [phone]  - Dra. Nicole Kindred: [phone]  En caso de inclemencias del [place], por favor llame a Johnsie Kindred principal al [phone] para una actualizacin sobre el [place] de cualquier retraso o cierre.  Consejos para la medicacin en dermatologa: Por favor, guarde las cajas en las que vienen los medicamentos de uso tpico para ayudarle a seguir las instrucciones sobre dnde y cmo usarlos. Las farmacias generalmente imprimen las instrucciones del medicamento slo en las cajas y no directamente en los tubos del [place].   Si su medicamento es muy caro, por favor, pngase en contacto con Zigmund Daniel llamando al [phone] y presione la opcin 4 o envenos un mensaje a travs de Pharmacist, community.   No podemos decirle cul ser su copago por los medicamentos por adelantado ya que esto es diferente dependiendo de la cobertura de su seguro. Sin embargo, es  posible que podamos encontrar un medicamento sustituto a Electrical engineer un formulario para que el seguro cubra el medicamento que se considera necesario.   Si se requiere una autorizacin previa para que su compaa de seguros [place] su medicamento, por favor  permtanos de 1 a 2 das hbiles para completar este proceso.  Los precios de los medicamentos varan con frecuencia dependiendo del Environmental consultant de dnde se surte la receta y alguna farmacias pueden ofrecer precios ms baratos.  El sitio web www.goodrx.com tiene cupones para medicamentos de Airline pilot. Los precios aqu no tienen en cuenta lo que podra costar con la ayuda del seguro (puede ser ms barato con su seguro), pero el sitio web puede darle el precio si no utiliz Research scientist (physical sciences).  - Puede imprimir el cupn correspondiente y llevarlo con su receta a la farmacia.  - Tambin puede pasar por nuestra oficina durante el horario de atencin regular y Charity fundraiser una tarjeta de cupones de GoodRx.  - Si necesita que su receta se enve electrnicamente a una farmacia diferente, informe a nuestra oficina a travs de MyChart de [place] o por telfono llamando al [phone] y presione la opcin 4.

## 2021-08-21 NOTE — Progress Notes (Unsigned)
Follow-Up Visit   Subjective  Stephen Fry is a 71 y.o. male who presents for the following: Other (Concerned about a tender spot at left forehead that he noticed about 2 months ago, spot at left wrist, and a spot at left cheek reports has been treated with Ln2 that came back. ).  The patient has spots, moles and lesions to be evaluated, some may be new or changing and the patient has concerns that these could be cancer.   The following portions of the chart were reviewed this encounter and updated as appropriate:      Review of Systems: No other skin or systemic complaints except as noted in HPI or Assessment and Plan.   Objective  Well appearing patient in no apparent distress; mood and affect are within normal limits.  A focused examination was performed including left cheek, left forehead, left wrist, face . Relevant physical exam findings are noted in the Assessment and Plan.  left cheek infraorbital x 1 Erythematous thin papules/macules with gritty scale.   left upper forehead 5 mm keratotic papule       left wrist x 1 Erythematous stuck-on, waxy papule   Assessment & Plan  Actinic keratosis left cheek infraorbital x 1  Residual ak at left cheek infraorbital   Actinic keratoses are precancerous spots that appear secondary to cumulative UV radiation exposure/sun exposure over time. They are chronic with expected duration over 1 year. A portion of actinic keratoses will progress to squamous cell carcinoma of the skin. It is not possible to reliably predict which spots will progress to skin cancer and so treatment is recommended to prevent development of skin cancer.  Recommend daily broad spectrum sunscreen SPF 30+ to sun-exposed areas, reapply every 2 hours as needed.  Recommend staying in the shade or wearing long sleeves, sun glasses (UVA+UVB protection) and wide brim hats (4-inch brim around the entire circumference of the hat). Call for new or changing  lesions.  Destruction of lesion - left cheek infraorbital x 1  Destruction method: cryotherapy   Informed consent: discussed and consent obtained   Lesion destroyed using liquid nitrogen: Yes   Region frozen until ice ball extended beyond lesion: Yes   Outcome: patient tolerated procedure well with no complications   Post-procedure details: wound care instructions given   Additional details:  Prior to procedure, discussed risks of blister formation, small wound, skin dyspigmentation, or rare scar following cryotherapy. Recommend Vaseline ointment to treated areas while healing.   Neoplasm of uncertain behavior left upper forehead  Epidermal / dermal shaving  Lesion diameter (cm):  0.6 Informed consent: discussed and consent obtained   Patient was prepped and draped in usual sterile fashion: Area prepped with alcohol. Anesthesia: the lesion was anesthetized in a standard fashion   Anesthetic:  1% lidocaine w/ epinephrine 1-100,000 buffered w/ 8.4% NaHCO3 Instrument used: flexible razor blade   Hemostasis achieved with: pressure, aluminum chloride and electrodesiccation   Outcome: patient tolerated procedure well    Destruction of lesion  Destruction method: electrodesiccation and curettage   Informed consent: discussed and consent obtained   Timeout:  patient name, date of birth, surgical site, and procedure verified Curettage performed in three different directions: Yes   Electrodesiccation performed over the curetted area: Yes   Final wound size (cm):  0.7 Hemostasis achieved with:  pressure, aluminum chloride and electrodesiccation Outcome: patient tolerated procedure well with no complications   Post-procedure details: wound care instructions given   Additional details:  Mupirocin ointment and Bandaid applied  Specimen 1 - Surgical pathology Differential Diagnosis: Isk vs hypertropic vs verruca ak r/o bcc   Check Margins: No  Isk vs hypertrophic ak vs verruca  r/o scc    Inflamed seborrheic keratosis left wrist x 1  Symptomatic, irritating, patient would like treated.  Destruction of lesion - left wrist x 1  Destruction method: cryotherapy   Informed consent: discussed and consent obtained   Lesion destroyed using liquid nitrogen: Yes   Region frozen until ice ball extended beyond lesion: Yes   Outcome: patient tolerated procedure well with no complications   Post-procedure details: wound care instructions given   Additional details:  Prior to procedure, discussed risks of blister formation, small wound, skin dyspigmentation, or rare scar following cryotherapy. Recommend Vaseline ointment to treated areas while healing.   Actinic Damage - chronic, secondary to cumulative UV radiation exposure/sun exposure over time - diffuse scaly erythematous macules with underlying dyspigmentation - Recommend daily broad spectrum sunscreen SPF 30+ to sun-exposed areas, reapply every 2 hours as needed.  - Recommend staying in the shade or wearing long sleeves, sun glasses (UVA+UVB protection) and wide brim hats (4-inch brim around the entire circumference of the hat). - Call for new or changing lesions.   Return for keep follow up as scheduled . I, Ruthell Rummage, CMA, am acting as scribe for Brendolyn Patty, MD.  Documentation: I have reviewed the above documentation for accuracy and completeness, and I agree with the above.  Brendolyn Patty MD

## 2021-08-26 ENCOUNTER — Telehealth: Payer: Self-pay

## 2021-08-26 NOTE — Telephone Encounter (Signed)
-----   Message from Brendolyn Patty, MD sent at 08/26/2021  1:27 PM EDT ----- Skin , left upper forehead SEBORRHEIC KERATOSIS, IRRITATED  Benign  - please call patient

## 2021-08-26 NOTE — Telephone Encounter (Signed)
Advised pt of bx result/sh ?

## 2022-01-22 ENCOUNTER — Ambulatory Visit: Payer: Medicare HMO | Admitting: Dermatology

## 2022-01-22 DIAGNOSIS — Z85828 Personal history of other malignant neoplasm of skin: Secondary | ICD-10-CM

## 2022-01-22 DIAGNOSIS — L820 Inflamed seborrheic keratosis: Secondary | ICD-10-CM

## 2022-01-22 DIAGNOSIS — Z1283 Encounter for screening for malignant neoplasm of skin: Secondary | ICD-10-CM | POA: Diagnosis not present

## 2022-01-22 DIAGNOSIS — D229 Melanocytic nevi, unspecified: Secondary | ICD-10-CM

## 2022-01-22 DIAGNOSIS — D692 Other nonthrombocytopenic purpura: Secondary | ICD-10-CM

## 2022-01-22 DIAGNOSIS — L814 Other melanin hyperpigmentation: Secondary | ICD-10-CM

## 2022-01-22 DIAGNOSIS — L578 Other skin changes due to chronic exposure to nonionizing radiation: Secondary | ICD-10-CM

## 2022-01-22 DIAGNOSIS — L821 Other seborrheic keratosis: Secondary | ICD-10-CM

## 2022-01-22 NOTE — Patient Instructions (Signed)
Due to recent changes in healthcare laws, you may see results of your pathology and/or laboratory studies on MyChart before the doctors have had a chance to review them. We understand that in some cases there may be results that are confusing or concerning to you. Please understand that not all results are received at the same time and often the doctors may need to interpret multiple results in order to provide you with the best plan of care or course of treatment. Therefore, we ask that you please give us 2 business days to thoroughly review all your results before contacting the office for clarification. Should we see a critical lab result, you will be contacted sooner.   If You Need Anything After Your Visit  If you have any questions or concerns for your doctor, please call our main line at [phone] and press option 4 to reach your doctor's medical assistant. If no one answers, please leave a voicemail as directed and we will return your call as soon as possible. Messages left after 4 pm will be answered the following business day.   You may also send us a message via MyChart. We typically respond to MyChart messages within 1-2 business days.  For prescription refills, please ask your pharmacy to contact our office. Our fax number is [phone].  If you have an urgent issue when the clinic is closed that cannot wait until the next business day, you can page your doctor at the number below.    Please note that while we do our best to be available for urgent issues outside of office hours, we are not available 24/7.   If you have an urgent issue and are unable to reach us, you may choose to seek medical care at your doctor's office, retail clinic, urgent care center, or emergency room.  If you have a medical emergency, please immediately call 911 or go to the emergency department.  Pager Numbers  - Dr. Kowalski: [phone]  - Dr. Moye: [phone]  - Dr. Stewart:  [phone]  In the event of inclement weather, please call our main line at [phone] for an update on the status of any delays or closures.  Dermatology Medication Tips: Please keep the boxes that topical medications come in in order to help keep track of the instructions about where and how to use these. Pharmacies typically print the medication instructions only on the boxes and not directly on the medication tubes.   If your medication is too expensive, please contact our office at [phone] option 4 or send us a message through MyChart.   We are unable to tell what your co-pay for medications will be in advance as this is different depending on your insurance coverage. However, we may be able to find a substitute medication at lower cost or fill out paperwork to get insurance to cover a needed medication.   If a prior authorization is required to get your medication covered by your insurance company, please allow us 1-2 business days to complete this process.  Drug prices often vary depending on where the prescription is filled and some pharmacies may offer cheaper prices.  The website www.goodrx.com contains coupons for medications through different pharmacies. The prices here do not account for what the cost may be with help from insurance (it may be cheaper with your insurance), but the website can give you the price if you did not use any insurance.  - You can print the associated coupon and take it with   your prescription to the pharmacy.  - You may also stop by our office during regular business hours and pick up a GoodRx coupon card.  - If you need your prescription sent electronically to a different pharmacy, notify our office through [facility] MyChart or by phone at [phone] option 4.     Si Usted Necesita Algo Despus de Su Visita  Tambin puede enviarnos un mensaje a travs de MyChart. Por lo general respondemos a los mensajes de MyChart en el transcurso de 1 a 2  das hbiles.  Para renovar recetas, por favor pida a su farmacia que se ponga en contacto con nuestra oficina. Nuestro nmero de fax es el [phone].  Si tiene un asunto urgente cuando la clnica est cerrada y que no puede esperar hasta el siguiente da hbil, puede llamar/localizar a su doctor(a) al nmero que aparece a continuacin.   Por favor, tenga en cuenta que aunque hacemos todo lo posible para estar disponibles para asuntos urgentes fuera del horario de oficina, no estamos disponibles las 24 horas del da, los 7 das de la semana.   Si tiene un problema urgente y no puede comunicarse con nosotros, puede optar por buscar atencin mdica  en el consultorio de su doctor(a), en una clnica privada, en un centro de atencin urgente o en una sala de emergencias.  Si tiene una emergencia mdica, por favor llame inmediatamente al 911 o vaya a la sala de emergencias.  Nmeros de bper  - Dr. Kowalski: [phone]  - Dra. Moye: [phone]  - Dra. Stewart: [phone]  En caso de inclemencias del tiempo, por favor llame a nuestra lnea principal al [phone] para una actualizacin sobre el estado de cualquier retraso o cierre.  Consejos para la medicacin en dermatologa: Por favor, guarde las cajas en las que vienen los medicamentos de uso tpico para ayudarle a seguir las instrucciones sobre dnde y cmo usarlos. Las farmacias generalmente imprimen las instrucciones del medicamento slo en las cajas y no directamente en los tubos del medicamento.   Si su medicamento es muy caro, por favor, pngase en contacto con nuestra oficina llamando al [phone] y presione la opcin 4 o envenos un mensaje a travs de MyChart.   No podemos decirle cul ser su copago por los medicamentos por adelantado ya que esto es diferente dependiendo de la cobertura de su seguro. Sin embargo, es posible que podamos encontrar un medicamento sustituto a menor costo o llenar un formulario para que el  seguro cubra el medicamento que se considera necesario.   Si se requiere una autorizacin previa para que su compaa de seguros cubra su medicamento, por favor permtanos de 1 a 2 das hbiles para completar este proceso.  Los precios de los medicamentos varan con frecuencia dependiendo del lugar de dnde se surte la receta y alguna farmacias pueden ofrecer precios ms baratos.  El sitio web www.goodrx.com tiene cupones para medicamentos de diferentes farmacias. Los precios aqu no tienen en cuenta lo que podra costar con la ayuda del seguro (puede ser ms barato con su seguro), pero el sitio web puede darle el precio si no utiliz ningn seguro.  - Puede imprimir el cupn correspondiente y llevarlo con su receta a la farmacia.  - Tambin puede pasar por nuestra oficina durante el horario de atencin regular y recoger una tarjeta de cupones de GoodRx.  - Si necesita que su receta se enve electrnicamente a una farmacia diferente, informe a nuestra oficina a travs de MyChart de [place]   o por telfono llamando al [phone] y presione la opcin 4.  

## 2022-01-22 NOTE — Progress Notes (Signed)
   Follow-Up Visit   Subjective  Stephen Fry is a 71 y.o. male who presents for the following: Annual Exam (History of BCC and AK  - The patient presents for Total-Body Skin Exam (TBSE) for skin cancer screening and mole check.  The patient has spots, moles and lesions to be evaluated, some may be new or changing and the patient has concerns that these could be cancer./).  The following portions of the chart were reviewed this encounter and updated as appropriate:   Tobacco  Allergies  Meds  Problems  Med Hx  Surg Hx  Fam Hx     Review of Systems:  No other skin or systemic complaints except as noted in HPI or Assessment and Plan.  Objective  Well appearing patient in no apparent distress; mood and affect are within normal limits.  A full examination was performed including scalp, head, eyes, ears, nose, lips, neck, chest, axillae, abdomen, back, buttocks, bilateral upper extremities, bilateral lower extremities, hands, feet, fingers, toes, fingernails, and toenails. All findings within normal limits unless otherwise noted below.  Left upper forehead Well healed biopsy site   Assessment & Plan   History of Basal Cell Carcinoma of the Skin - No evidence of recurrence today - Recommend regular full body skin exams - Recommend daily broad spectrum sunscreen SPF 30+ to sun-exposed areas, reapply every 2 hours as needed.  - Call if any new or changing lesions are noted between office visits  Purpura - Chronic; persistent and recurrent.  Treatable, but not curable. - Violaceous macules and patches - Benign - Related to trauma, age, sun damage and/or use of blood thinners, chronic use of topical and/or oral steroids - Observe - Can use OTC arnica containing moisturizer such as Dermend Bruise Formula if desired - Call for worsening or other concerns  Lentigines - Scattered tan macules - Due to sun exposure - Benign-appearing, observe - Recommend daily broad spectrum  sunscreen SPF 30+ to sun-exposed areas, reapply every 2 hours as needed. - Call for any changes  Seborrheic Keratoses - Stuck-on, waxy, tan-brown papules and/or plaques  - Benign-appearing - Discussed benign etiology and prognosis. - Observe - Call for any changes  Melanocytic Nevi - Tan-brown and/or pink-flesh-colored symmetric macules and papules - Benign appearing on exam today - Observation - Call clinic for new or changing moles - Recommend daily use of broad spectrum spf 30+ sunscreen to sun-exposed areas.   Hemangiomas - Red papules - Discussed benign nature - Observe - Call for any changes  Actinic Damage - Chronic condition, secondary to cumulative UV/sun exposure - diffuse scaly erythematous macules with underlying dyspigmentation - Recommend daily broad spectrum sunscreen SPF 30+ to sun-exposed areas, reapply every 2 hours as needed.  - Staying in the shade or wearing long sleeves, sun glasses (UVA+UVB protection) and wide brim hats (4-inch brim around the entire circumference of the hat) are also recommended for sun protection.  - Call for new or changing lesions.  Skin cancer screening performed today.  Inflamed seborrheic keratosis Left upper forehead  Clear. Observe for recurrence. Call clinic for new or changing lesions.  Recommend regular skin exams, daily broad-spectrum spf 30+ sunscreen use, and photoprotection.      Return in about 1 year (around 01/23/2023) for TBSE.  I, Ashok Cordia, CMA, am acting as scribe for Sarina Ser, MD . Documentation: I have reviewed the above documentation for accuracy and completeness, and I agree with the above.  Sarina Ser, MD

## 2022-01-31 ENCOUNTER — Encounter: Payer: Self-pay | Admitting: Dermatology

## 2023-01-28 ENCOUNTER — Ambulatory Visit: Payer: Medicare HMO | Admitting: Dermatology

## 2023-01-28 ENCOUNTER — Ambulatory Visit: Payer: Medicare HMO

## 2023-01-28 DIAGNOSIS — K640 First degree hemorrhoids: Secondary | ICD-10-CM | POA: Diagnosis not present

## 2023-01-28 DIAGNOSIS — Z860100 Personal history of colon polyps, unspecified: Secondary | ICD-10-CM | POA: Diagnosis not present

## 2023-01-28 DIAGNOSIS — Z1211 Encounter for screening for malignant neoplasm of colon: Secondary | ICD-10-CM | POA: Diagnosis present

## 2023-01-28 DIAGNOSIS — D123 Benign neoplasm of transverse colon: Secondary | ICD-10-CM | POA: Diagnosis not present

## 2023-01-28 DIAGNOSIS — D122 Benign neoplasm of ascending colon: Secondary | ICD-10-CM | POA: Diagnosis not present

## 2023-08-19 ENCOUNTER — Encounter: Payer: Self-pay | Admitting: Dermatology

## 2023-08-19 ENCOUNTER — Ambulatory Visit: Payer: Medicare HMO | Admitting: Dermatology

## 2023-08-19 DIAGNOSIS — L817 Pigmented purpuric dermatosis: Secondary | ICD-10-CM

## 2023-08-19 DIAGNOSIS — Z1283 Encounter for screening for malignant neoplasm of skin: Secondary | ICD-10-CM | POA: Diagnosis not present

## 2023-08-19 DIAGNOSIS — L814 Other melanin hyperpigmentation: Secondary | ICD-10-CM

## 2023-08-19 DIAGNOSIS — W908XXA Exposure to other nonionizing radiation, initial encounter: Secondary | ICD-10-CM | POA: Diagnosis not present

## 2023-08-19 DIAGNOSIS — D492 Neoplasm of unspecified behavior of bone, soft tissue, and skin: Secondary | ICD-10-CM | POA: Diagnosis not present

## 2023-08-19 DIAGNOSIS — L820 Inflamed seborrheic keratosis: Secondary | ICD-10-CM | POA: Diagnosis not present

## 2023-08-19 DIAGNOSIS — L821 Other seborrheic keratosis: Secondary | ICD-10-CM

## 2023-08-19 DIAGNOSIS — D1801 Hemangioma of skin and subcutaneous tissue: Secondary | ICD-10-CM

## 2023-08-19 DIAGNOSIS — Z85828 Personal history of other malignant neoplasm of skin: Secondary | ICD-10-CM

## 2023-08-19 DIAGNOSIS — L578 Other skin changes due to chronic exposure to nonionizing radiation: Secondary | ICD-10-CM

## 2023-08-19 DIAGNOSIS — L570 Actinic keratosis: Secondary | ICD-10-CM

## 2023-08-19 NOTE — Patient Instructions (Addendum)

## 2023-08-19 NOTE — Progress Notes (Signed)
 Follow-Up Visit   Subjective  Stephen Fry is a 73 y.o. male who presents for the following: Skin Cancer Screening and Full Body Skin Exam, hx of BCC, Aks, check spot inside L ear ~ 73yr, no symptoms, no changes, check spot cheek, txted with LN2 several times, pt states edge keeps coming back   The patient presents for Total-Body Skin Exam (TBSE) for skin cancer screening and mole check. The patient has spots, moles and lesions to be evaluated, some may be new or changing and the patient may have concern these could be cancer.  The following portions of the chart were reviewed this encounter and updated as appropriate: medications, allergies, medical history  Review of Systems:  No other skin or systemic complaints except as noted in HPI or Assessment and Plan.  Objective  Well appearing patient in no apparent distress; mood and affect are within normal limits.  A full examination was performed including scalp, head, eyes, ears, nose, lips, neck, chest, axillae, abdomen, back, buttocks, bilateral upper extremities, bilateral lower extremities, hands, feet, fingers, toes, fingernails, and toenails. All findings within normal limits unless otherwise noted below.   Relevant physical exam findings are noted in the Assessment and Plan.  left cheek infraorbitial 2.5 cm inferior to the left mid to lateral eyelash margin 0.6 x 0.3cm rough patch  bil arms x 15 (15) Stuck on waxy paps with erythema bil arms x 15 (15) Pink scaly macules  Assessment & Plan   SKIN CANCER SCREENING PERFORMED TODAY.  ACTINIC DAMAGE - Chronic condition, secondary to cumulative UV/sun exposure - diffuse scaly erythematous macules with underlying dyspigmentation - Recommend daily broad spectrum sunscreen SPF 30+ to sun-exposed areas, reapply every 2 hours as needed.  - Staying in the shade or wearing long sleeves, sun glasses (UVA+UVB protection) and wide brim hats (4-inch brim around the entire  circumference of the hat) are also recommended for sun protection.  - Call for new or changing lesions.  LENTIGINES, SEBORRHEIC KERATOSES, HEMANGIOMAS - Benign normal skin lesions - Benign-appearing - Call for any changes - SK L ear  MELANOCYTIC NEVI - Tan-brown and/or pink-flesh-colored symmetric macules and papules - Benign appearing on exam today - Observation - Call clinic for new or changing moles - Recommend daily use of broad spectrum spf 30+ sunscreen to sun-exposed areas.   HISTORY OF BASAL CELL CARCINOMA OF THE SKIN - No evidence of recurrence today - Recommend regular full body skin exams - Recommend daily broad spectrum sunscreen SPF 30+ to sun-exposed areas, reapply every 2 hours as needed.  - Call if any new or changing lesions are noted between office visits  - R forehead 3.0cm lat to brow, R proximal dorsum forearm near antecubital, R forearm  SCHAMBERG's PURPURA Exam: Erythematous, scaly patches involving the ankle and distal lower leg with associated lower leg edema. Chronic and persistent condition with duration or expected duration over one year. Condition is symptomatic / bothersome to patient. Not to goal. Stasis in the legs causes chronic leg swelling, which may result in itchy or painful rashes, skin discoloration, skin texture changes, and sometimes ulceration.  Recommend daily graduated compression hose/stockings- easiest to put on first thing in morning, remove at bedtime.  Elevate legs as much as possible. Avoid salt/sodium rich foods. Treatment Plan: Benign, observe Recommend compression socks qd  NEOPLASM OF SKIN left cheek infraorbitial 2.5 cm inferior to the left mid to lateral eyelash margin Epidermal / dermal shaving  Lesion diameter (cm):  0.6 Informed  consent: discussed and consent obtained   Timeout: patient name, date of birth, surgical site, and procedure verified   Procedure prep:  Patient was prepped and draped in usual sterile  fashion Prep type:  Isopropyl alcohol Anesthesia: the lesion was anesthetized in a standard fashion   Anesthetic:  1% lidocaine  w/ epinephrine  1-100,000 buffered w/ 8.4% NaHCO3 Instrument used: flexible razor blade   Hemostasis achieved with: pressure, aluminum chloride and electrodesiccation   Outcome: patient tolerated procedure well   Post-procedure details: sterile dressing applied and wound care instructions given   Dressing type: bandage and bacitracin    Destruction of lesion Complexity: extensive   Destruction method: electrodesiccation and curettage   Informed consent: discussed and consent obtained   Timeout:  patient name, date of birth, surgical site, and procedure verified Procedure prep:  Patient was prepped and draped in usual sterile fashion Prep type:  Isopropyl alcohol Anesthesia: the lesion was anesthetized in a standard fashion   Anesthetic:  1% lidocaine  w/ epinephrine  1-100,000 buffered w/ 8.4% NaHCO3 Curettage performed in three different directions: Yes   Electrodesiccation performed over the curetted area: Yes   Lesion length (cm):  0.6 Lesion width (cm):  0.3 Margin per side (cm):  0.2 Final wound size (cm):  1 Hemostasis achieved with:  pressure, aluminum chloride and electrodesiccation Outcome: patient tolerated procedure well with no complications   Post-procedure details: sterile dressing applied and wound care instructions given   Dressing type: bandage and bacitracin   Specimen 1 - Surgical pathology Differential Diagnosis: ISK vs AK r/o CA  Check Margins: yes 0.6 x 0.3cm rough patch EDC today INFLAMED SEBORRHEIC KERATOSIS (15) bil arms x 15 (15) Symptomatic, irritating, patient would like treated. Destruction of lesion - bil arms x 15 (15) Complexity: simple   Destruction method: cryotherapy   Informed consent: discussed and consent obtained   Timeout:  patient name, date of birth, surgical site, and procedure verified Lesion destroyed using  liquid nitrogen: Yes   Region frozen until ice ball extended beyond lesion: Yes   Outcome: patient tolerated procedure well with no complications   Post-procedure details: wound care instructions given   AK (ACTINIC KERATOSIS) (15) bil arms x 15 (15) Actinic keratoses are precancerous spots that appear secondary to cumulative UV radiation exposure/sun exposure over time. They are chronic with expected duration over 1 year. A portion of actinic keratoses will progress to squamous cell carcinoma of the skin. It is not possible to reliably predict which spots will progress to skin cancer and so treatment is recommended to prevent development of skin cancer.  Recommend daily broad spectrum sunscreen SPF 30+ to sun-exposed areas, reapply every 2 hours as needed.  Recommend staying in the shade or wearing long sleeves, sun glasses (UVA+UVB protection) and wide brim hats (4-inch brim around the entire circumference of the hat). Call for new or changing lesions. Destruction of lesion - bil arms x 15 (15) Complexity: simple   Destruction method: cryotherapy   Informed consent: discussed and consent obtained   Timeout:  patient name, date of birth, surgical site, and procedure verified Lesion destroyed using liquid nitrogen: Yes   Region frozen until ice ball extended beyond lesion: Yes   Outcome: patient tolerated procedure well with no complications   Post-procedure details: wound care instructions given   SKIN CANCER SCREENING   ACTINIC SKIN DAMAGE   HISTORY OF BASAL CELL CARCINOMA   SCHAMBERG'S PURPURA    Return in about 1 year (around 08/18/2024) for TBSE, Hx of  BCC, Hx of AKs.  I, Rollie Clipper, RMA, am acting as scribe for Celine Collard, MD .   Documentation: I have reviewed the above documentation for accuracy and completeness, and I agree with the above.  Celine Collard, MD

## 2023-08-24 ENCOUNTER — Ambulatory Visit: Payer: Self-pay | Admitting: Dermatology

## 2023-08-24 LAB — SURGICAL PATHOLOGY

## 2023-08-25 NOTE — Telephone Encounter (Addendum)
 Called and discussed results with patient. He verbalized understanding and denied further questions. ----- Message from Celine Collard sent at 08/24/2023  8:29 PM EDT ----- FINAL DIAGNOSIS        1. Skin, left cheek infraorbital 2.5 cm inferior to the left mid to lateral eyelash margin :       ACTINIC KERATOSIS   PreCancer  Already treated Recheck next visit

## 2024-08-24 ENCOUNTER — Ambulatory Visit: Admitting: Dermatology
